# Patient Record
Sex: Male | Born: 1970 | Hispanic: No | Marital: Single | Smoking: Never smoker
Health system: Southern US, Community
[De-identification: ages and names within clinical notes are randomized; demographics above are authoritative.]

## PROBLEM LIST (undated history)

## (undated) ENCOUNTER — Emergency Department (HOSPITAL_COMMUNITY): Admission: EM | Payer: Medicaid Other | Source: Home / Self Care

## (undated) DIAGNOSIS — G8929 Other chronic pain: Secondary | ICD-10-CM

## (undated) DIAGNOSIS — M549 Dorsalgia, unspecified: Secondary | ICD-10-CM

## (undated) DIAGNOSIS — I10 Essential (primary) hypertension: Secondary | ICD-10-CM

## (undated) HISTORY — PX: KNEE ARTHROSCOPY: SUR90

## (undated) HISTORY — PX: TONSILLECTOMY: SUR1361

## (undated) HISTORY — PX: HAND SURGERY: SHX662

---

## 1998-07-01 ENCOUNTER — Emergency Department (HOSPITAL_COMMUNITY): Admission: EM | Admit: 1998-07-01 | Discharge: 1998-07-01 | Payer: Self-pay | Admitting: Emergency Medicine

## 1999-07-07 ENCOUNTER — Emergency Department (HOSPITAL_COMMUNITY): Admission: EM | Admit: 1999-07-07 | Discharge: 1999-07-07 | Payer: Self-pay | Admitting: Emergency Medicine

## 1999-07-07 ENCOUNTER — Encounter: Payer: Self-pay | Admitting: Emergency Medicine

## 2002-05-20 ENCOUNTER — Encounter: Payer: Self-pay | Admitting: Emergency Medicine

## 2002-05-20 ENCOUNTER — Emergency Department (HOSPITAL_COMMUNITY): Admission: EM | Admit: 2002-05-20 | Discharge: 2002-05-21 | Payer: Self-pay

## 2006-02-01 ENCOUNTER — Emergency Department (HOSPITAL_COMMUNITY): Admission: EM | Admit: 2006-02-01 | Discharge: 2006-02-02 | Payer: Self-pay | Admitting: Emergency Medicine

## 2007-07-02 ENCOUNTER — Emergency Department (HOSPITAL_COMMUNITY): Admission: EM | Admit: 2007-07-02 | Discharge: 2007-07-02 | Payer: Self-pay | Admitting: Emergency Medicine

## 2008-11-21 ENCOUNTER — Emergency Department (HOSPITAL_COMMUNITY): Admission: EM | Admit: 2008-11-21 | Discharge: 2008-11-21 | Payer: Self-pay | Admitting: Emergency Medicine

## 2009-01-09 ENCOUNTER — Emergency Department (HOSPITAL_COMMUNITY): Admission: EM | Admit: 2009-01-09 | Discharge: 2009-01-09 | Payer: Self-pay | Admitting: Emergency Medicine

## 2009-03-10 ENCOUNTER — Emergency Department (HOSPITAL_COMMUNITY): Admission: EM | Admit: 2009-03-10 | Discharge: 2009-03-10 | Payer: Self-pay | Admitting: Emergency Medicine

## 2009-05-27 ENCOUNTER — Emergency Department (HOSPITAL_COMMUNITY): Admission: EM | Admit: 2009-05-27 | Discharge: 2009-05-27 | Payer: Self-pay | Admitting: Emergency Medicine

## 2009-06-07 ENCOUNTER — Emergency Department (HOSPITAL_COMMUNITY): Admission: EM | Admit: 2009-06-07 | Discharge: 2009-06-07 | Payer: Self-pay | Admitting: Emergency Medicine

## 2009-06-22 ENCOUNTER — Ambulatory Visit: Payer: Self-pay | Admitting: Vascular Surgery

## 2009-06-22 ENCOUNTER — Emergency Department (HOSPITAL_COMMUNITY): Admission: EM | Admit: 2009-06-22 | Discharge: 2009-06-22 | Payer: Self-pay | Admitting: Emergency Medicine

## 2009-06-22 ENCOUNTER — Encounter (INDEPENDENT_AMBULATORY_CARE_PROVIDER_SITE_OTHER): Payer: Self-pay | Admitting: Emergency Medicine

## 2009-10-17 ENCOUNTER — Emergency Department (HOSPITAL_COMMUNITY): Admission: EM | Admit: 2009-10-17 | Discharge: 2009-10-17 | Payer: Self-pay | Admitting: Emergency Medicine

## 2009-10-31 ENCOUNTER — Emergency Department (HOSPITAL_COMMUNITY): Admission: EM | Admit: 2009-10-31 | Discharge: 2009-10-31 | Payer: Self-pay | Admitting: Emergency Medicine

## 2009-12-27 ENCOUNTER — Emergency Department (HOSPITAL_COMMUNITY)
Admission: EM | Admit: 2009-12-27 | Discharge: 2009-12-27 | Payer: Self-pay | Source: Home / Self Care | Admitting: Emergency Medicine

## 2010-03-28 ENCOUNTER — Emergency Department (HOSPITAL_COMMUNITY): Payer: Medicaid Other

## 2010-03-28 ENCOUNTER — Emergency Department (HOSPITAL_COMMUNITY)
Admission: EM | Admit: 2010-03-28 | Discharge: 2010-03-28 | Disposition: A | Discharge status: Home / Self Care | Payer: Medicaid Other | Attending: Emergency Medicine | Admitting: Emergency Medicine

## 2010-03-28 DIAGNOSIS — R079 Chest pain, unspecified: Secondary | ICD-10-CM | POA: Insufficient documentation

## 2010-03-28 DIAGNOSIS — I10 Essential (primary) hypertension: Secondary | ICD-10-CM | POA: Insufficient documentation

## 2010-03-28 DIAGNOSIS — M542 Cervicalgia: Secondary | ICD-10-CM | POA: Insufficient documentation

## 2010-03-28 DIAGNOSIS — S20219A Contusion of unspecified front wall of thorax, initial encounter: Secondary | ICD-10-CM | POA: Insufficient documentation

## 2010-03-28 DIAGNOSIS — M79609 Pain in unspecified limb: Secondary | ICD-10-CM | POA: Insufficient documentation

## 2010-03-28 DIAGNOSIS — M25519 Pain in unspecified shoulder: Secondary | ICD-10-CM | POA: Insufficient documentation

## 2010-03-28 DIAGNOSIS — S0990XA Unspecified injury of head, initial encounter: Secondary | ICD-10-CM | POA: Insufficient documentation

## 2010-03-28 DIAGNOSIS — S8010XA Contusion of unspecified lower leg, initial encounter: Secondary | ICD-10-CM | POA: Insufficient documentation

## 2010-03-28 DIAGNOSIS — IMO0002 Reserved for concepts with insufficient information to code with codable children: Secondary | ICD-10-CM | POA: Insufficient documentation

## 2010-03-28 LAB — CBC
HCT: 47.5 % (ref 39.0–52.0)
MCV: 89.1 fL (ref 78.0–100.0)
Platelets: 235 10*3/uL (ref 150–400)
RBC: 5.33 MIL/uL (ref 4.22–5.81)
WBC: 8.3 10*3/uL (ref 4.0–10.5)

## 2010-03-28 LAB — DIFFERENTIAL
Basophils Absolute: 0 10*3/uL (ref 0.0–0.1)
Eosinophils Absolute: 0.2 10*3/uL (ref 0.0–0.7)
Lymphocytes Relative: 25 % (ref 12–46)
Lymphs Abs: 2.1 10*3/uL (ref 0.7–4.0)
Neutrophils Relative %: 60 % (ref 43–77)

## 2010-03-28 LAB — BASIC METABOLIC PANEL
Calcium: 9.3 mg/dL (ref 8.4–10.5)
GFR calc Af Amer: >60 mL/min (ref >60)
GFR calc non Af Amer: >60 mL/min (ref >60)
Glucose, Bld: 101 mg/dL — ABNORMAL HIGH (ref 70–99)
Potassium: 4.1 mEq/L (ref 3.5–5.1)
Sodium: 138 mEq/L (ref 135–145)

## 2010-04-06 LAB — URINALYSIS, ROUTINE W REFLEX MICROSCOPIC
Bilirubin Urine: NEGATIVE
Nitrite: NEGATIVE
Specific Gravity, Urine: 1.004 — ABNORMAL LOW (ref 1.005–1.030)
Urobilinogen, UA: 0.2 mg/dL (ref 0.0–1.0)
pH: 6 (ref 5.0–8.0)

## 2010-07-03 ENCOUNTER — Emergency Department (HOSPITAL_COMMUNITY): Payer: Medicaid Other

## 2010-07-03 ENCOUNTER — Emergency Department (HOSPITAL_COMMUNITY)
Admission: EM | Admit: 2010-07-03 | Discharge: 2010-07-03 | Disposition: A | Discharge status: Home / Self Care | Payer: Medicaid Other | Attending: Emergency Medicine | Admitting: Emergency Medicine

## 2010-07-03 DIAGNOSIS — M545 Low back pain, unspecified: Secondary | ICD-10-CM | POA: Insufficient documentation

## 2010-07-03 DIAGNOSIS — I10 Essential (primary) hypertension: Secondary | ICD-10-CM | POA: Insufficient documentation

## 2010-07-03 DIAGNOSIS — M549 Dorsalgia, unspecified: Secondary | ICD-10-CM | POA: Insufficient documentation

## 2010-07-31 ENCOUNTER — Emergency Department (HOSPITAL_COMMUNITY)
Admission: EM | Admit: 2010-07-31 | Discharge: 2010-07-31 | Disposition: A | Discharge status: Home / Self Care | Payer: Medicaid Other | Attending: Emergency Medicine | Admitting: Emergency Medicine

## 2010-07-31 DIAGNOSIS — M545 Low back pain, unspecified: Secondary | ICD-10-CM | POA: Insufficient documentation

## 2010-07-31 DIAGNOSIS — G8929 Other chronic pain: Secondary | ICD-10-CM | POA: Insufficient documentation

## 2010-09-23 ENCOUNTER — Emergency Department (HOSPITAL_COMMUNITY)
Admission: EM | Admit: 2010-09-23 | Discharge: 2010-09-23 | Disposition: A | Discharge status: Home / Self Care | Payer: Medicaid Other | Attending: Emergency Medicine | Admitting: Emergency Medicine

## 2010-09-23 DIAGNOSIS — M545 Low back pain, unspecified: Secondary | ICD-10-CM | POA: Insufficient documentation

## 2010-09-23 DIAGNOSIS — L298 Other pruritus: Secondary | ICD-10-CM | POA: Insufficient documentation

## 2010-09-23 DIAGNOSIS — R21 Rash and other nonspecific skin eruption: Secondary | ICD-10-CM | POA: Insufficient documentation

## 2010-09-23 DIAGNOSIS — Z79899 Other long term (current) drug therapy: Secondary | ICD-10-CM | POA: Insufficient documentation

## 2010-09-23 DIAGNOSIS — I10 Essential (primary) hypertension: Secondary | ICD-10-CM | POA: Insufficient documentation

## 2010-09-23 DIAGNOSIS — G8929 Other chronic pain: Secondary | ICD-10-CM | POA: Insufficient documentation

## 2010-09-23 DIAGNOSIS — L2989 Other pruritus: Secondary | ICD-10-CM | POA: Insufficient documentation

## 2010-09-23 DIAGNOSIS — L255 Unspecified contact dermatitis due to plants, except food: Secondary | ICD-10-CM | POA: Insufficient documentation

## 2010-09-23 DIAGNOSIS — T622X1A Toxic effect of other ingested (parts of) plant(s), accidental (unintentional), initial encounter: Secondary | ICD-10-CM | POA: Insufficient documentation

## 2011-03-11 ENCOUNTER — Encounter (HOSPITAL_COMMUNITY): Payer: Self-pay | Admitting: *Deleted

## 2011-03-11 ENCOUNTER — Emergency Department (HOSPITAL_COMMUNITY)
Admission: EM | Admit: 2011-03-11 | Discharge: 2011-03-11 | Disposition: A | Discharge status: Home / Self Care | Payer: Medicaid Other | Attending: Emergency Medicine | Admitting: Emergency Medicine

## 2011-03-11 DIAGNOSIS — I10 Essential (primary) hypertension: Secondary | ICD-10-CM | POA: Insufficient documentation

## 2011-03-11 DIAGNOSIS — M549 Dorsalgia, unspecified: Secondary | ICD-10-CM | POA: Insufficient documentation

## 2011-03-11 HISTORY — DX: Essential (primary) hypertension: I10

## 2011-03-11 MED ORDER — HYDROMORPHONE HCL PF 1 MG/ML IJ SOLN
1.0000 mg | Freq: Once | INTRAMUSCULAR | Status: AC
  Administered 2011-03-11: 1 mg via INTRAMUSCULAR
  Filled 2011-03-11: qty 1

## 2011-03-11 MED ORDER — NAPROXEN 500 MG PO TABS: 500.0000 mg | ORAL_TABLET | Freq: Two times a day (BID) | ORAL | Status: DC

## 2011-03-11 MED ORDER — OXYCODONE-ACETAMINOPHEN 5-325 MG PO TABS: 1.0000 | ORAL_TABLET | ORAL | Status: AC | PRN

## 2011-03-11 MED ORDER — DIAZEPAM 5 MG PO TABS: 5.0000 mg | ORAL_TABLET | Freq: Three times a day (TID) | ORAL | Status: AC | PRN

## 2011-03-11 MED ORDER — LISINOPRIL 10 MG PO TABS: 10.0000 mg | ORAL_TABLET | Freq: Every day | ORAL | Status: DC

## 2011-03-11 MED ORDER — DIAZEPAM 5 MG PO TABS
5.0000 mg | ORAL_TABLET | Freq: Once | ORAL | Status: AC
  Administered 2011-03-11: 5 mg via ORAL
  Filled 2011-03-11: qty 1

## 2011-03-11 MED ORDER — IBUPROFEN 200 MG PO TABS: 400.0000 mg | ORAL_TABLET | Freq: Once | ORAL | Status: DC

## 2011-03-11 NOTE — Discharge Instructions (Signed)
Back Exercises Back exercises help treat and prevent back injuries. The goal of back exercises is to increase the strength of your abdominal and back muscles and the flexibility of your back. These exercises should be started when you no longer have back pain. Back exercises include:  Pelvic Tilt. Lie on your back with your knees bent. Tilt your pelvis until the lower part of your back is against the floor. Hold this position 5 to 10 sec and repeat 5 to 10 times.   Knee to Chest. Pull first 1 knee up against your chest and hold for 20 to 30 seconds, repeat this with the other knee, and then both knees. This may be done with the other leg straight or bent, whichever feels better.   Sit-Ups or Curl-Ups. Bend your knees 90 degrees. Start with tilting your pelvis, and do a partial, slow sit-up, lifting your trunk only 30 to 45 degrees off the floor. Take at least 2 to 3 seconds for each sit-up. Do not do sit-ups with your knees out straight. If partial sit-ups are difficult, simply do the above but with only tightening your abdominal muscles and holding it as directed.   Hip-Lift. Lie on your back with your knees flexed 90 degrees. Push down with your feet and shoulders as you raise your hips a couple inches off the floor; hold for 10 seconds, repeat 5 to 10 times.   Back arches. Lie on your stomach, propping yourself up on bent elbows. Slowly press on your hands, causing an arch in your low back. Repeat 3 to 5 times. Any initial stiffness and discomfort should lessen with repetition over time.   Shoulder-Lifts. Lie face down with arms beside your body. Keep hips and torso pressed to floor as you slowly lift your head and shoulders off the floor.  Do not overdo your exercises, especially in the beginning. Exercises may cause you some mild back discomfort which lasts for a few minutes; however, if the pain is more severe, or lasts for more than 15 minutes, do not continue exercises until you see your  caregiver. Improvement with exercise therapy for back problems is slow.  See your caregivers for assistance with developing a proper back exercise program. Document Released: 02/12/2004 Document Revised: 09/02/2010 Document Reviewed: 01/04/2005 [facility] Patient Information 2012 [place], [place].Back Pain, Adult Low back pain is very common. About 1 in 5 people have back pain.The cause of low back pain is rarely dangerous. The pain often gets better over time.About half of people with a sudden onset of back pain feel better in just 2 weeks. About 8 in 10 people feel better by 6 weeks.  CAUSES Some common causes of back pain include:  Strain of the muscles or ligaments supporting the spine.   Wear and tear (degeneration) of the spinal discs.   Arthritis.   Direct injury to the back.  DIAGNOSIS Most of the time, the direct cause of low back pain is not known.However, back pain can be treated effectively even when the exact cause of the pain is unknown.Answering your caregiver's questions about your overall health and symptoms is one of the most accurate ways to make sure the cause of your pain is not dangerous. If your caregiver needs more information, he or she may order lab work or imaging tests (X-rays or MRIs).However, even if imaging tests show changes in your back, this usually does not require surgery. HOME CARE INSTRUCTIONS For many people, back pain returns.Since low back pain is rarely  dangerous, it is often a condition that people can learn to [facility] their own.   Remain active. It is stressful on the back to sit or stand in one place. Do not sit, drive, or stand in one place for more than 30 minutes at a time. Take short walks on level surfaces as soon as pain allows.Try to increase the length of time you walk each day.   Do not stay in bed.Resting more than 1 or 2 days can delay your recovery.   Do not avoid exercise or work.Your body is made to move.It is not dangerous  to be active, even though your back may hurt.Your back will likely heal faster if you return to being active before your pain is gone.   Pay attention to your body when you bend and lift. Many people have less discomfortwhen lifting if they bend their knees, keep the load close to their bodies,and avoid twisting. Often, the most comfortable positions are those that put less stress on your recovering back.   Find a comfortable position to sleep. Use a firm mattress and lie on your side with your knees slightly bent. If you lie on your back, put a pillow under your knees.   Only take over-the-counter or prescription medicines as directed by your caregiver. Over-the-counter medicines to reduce pain and inflammation are often the most helpful.Your caregiver may prescribe muscle relaxant drugs.These medicines help dull your pain so you can more quickly return to your normal activities and healthy exercise.   Put ice on the injured area.   Put ice in a plastic bag.   Place a towel between your skin and the bag.   Leave the ice on for 15 to 20 minutes, 3 to 4 times a day for the first 2 to 3 days. After that, ice and heat may be alternated to reduce pain and spasms.   Ask your caregiver about trying back exercises and gentle massage. This may be of some benefit.   Avoid feeling anxious or stressed.Stress increases muscle tension and can worsen back pain.It is important to recognize when you are anxious or stressed and learn ways to manage it.Exercise is a great option.  SEEK MEDICAL CARE IF:  You have pain that is not relieved with rest or medicine.   You have pain that does not improve in 1 week.   You have new symptoms.   You are generally not feeling well.  SEEK IMMEDIATE MEDICAL CARE IF:   You have pain that radiates from your back into your legs.   You develop new bowel or bladder control problems.   You have unusual weakness or numbness in your arms or legs.   You develop  nausea or vomiting.   You develop abdominal pain.   You feel faint.  Document Released: 01/04/2005 Document Revised: 09/16/2010 Document Reviewed: 05/25/2010 [facility] Patient Information 2012 [place], [place].  RESOURCE GUIDE  Dental Problems  Patients with Medicaid: [facility] [phone] [address]                                           [phone] [address] Phone:  [phone]  Phone:  [phone]  If unable to pay or uninsured, contact:  [facility] or [facility]. to become qualified for the adult dental clinic.  Chronic Pain Problems Contact [facility]  [phone] Patients need to be referred by their primary care doctor.  Insufficient Money for Medicine Contact United Way:  call "211" or [facility] [phone].  No Primary Care Doctor Call [facility]  [phone] Other agencies that provide inexpensive medical care    [facility]  [phone]    [facility]  [phone]    [facility]  [phone]    [facility]  [phone]    [facility]  [phone]    [facility]  [phone]  Psychological Services [facility] Behavioral Health  [phone] [facility] Services  [phone] [facility] Mental Health   [phone] (emergency services [phone])  Substance Abuse Resources [facility] Drug [facility]  [phone] [facility] [phone] [facility] [place] [phone] Floydene Flock [phone] Residential & Outpatient Substance Abuse Program  [phone]  Abuse/Neglect [facility] Child Abuse Hotline 513 191 2877 [facility] Child Abuse Hotline (504) 009-7940 (After Hours)  Emergency Shelter [facility] [phone]  Maternity Homes [facility] [place] [facility] [place] [phone] [facility] [phone]  MRSA Hotline #:    920-203-4832    [facility] Resources  [facility] [place]     United Way                          [facility] Dept. [address] [place]                       [address]      [address]  Blondell Reveal Phone:  [phone]                                   Phone:  [phone]                 Phone:  [phone]  [facility] Mental Health Phone:  [phone]  [facility] Child Abuse Hotline 709-250-8669 [phone] (After Hours)

## 2011-03-11 NOTE — ED Notes (Signed)
Patient is resting comfortably. 

## 2011-03-11 NOTE — ED Notes (Signed)
Vital signs stable. 
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
## 2011-03-11 NOTE — ED Notes (Signed)
MD at bedside. 
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
## 2011-04-08 ENCOUNTER — Emergency Department (HOSPITAL_COMMUNITY)
Admission: EM | Admit: 2011-04-08 | Discharge: 2011-04-08 | Disposition: A | Discharge status: Home / Self Care | Payer: Medicaid Other | Attending: Emergency Medicine | Admitting: Emergency Medicine

## 2011-04-08 ENCOUNTER — Encounter (HOSPITAL_COMMUNITY): Payer: Self-pay

## 2011-04-08 DIAGNOSIS — Z79899 Other long term (current) drug therapy: Secondary | ICD-10-CM | POA: Insufficient documentation

## 2011-04-08 DIAGNOSIS — I10 Essential (primary) hypertension: Secondary | ICD-10-CM | POA: Insufficient documentation

## 2011-04-08 DIAGNOSIS — M549 Dorsalgia, unspecified: Secondary | ICD-10-CM | POA: Insufficient documentation

## 2011-04-08 DIAGNOSIS — G8929 Other chronic pain: Secondary | ICD-10-CM | POA: Insufficient documentation

## 2011-04-08 DIAGNOSIS — M545 Low back pain: Secondary | ICD-10-CM

## 2011-04-08 MED ORDER — DIAZEPAM 5 MG PO TABS: 5.0000 mg | ORAL_TABLET | Freq: Two times a day (BID) | ORAL | Status: AC

## 2011-04-08 MED ORDER — OXYCODONE-ACETAMINOPHEN 5-325 MG PO TABS: 1.0000 | ORAL_TABLET | Freq: Four times a day (QID) | ORAL | Status: AC | PRN

## 2011-04-08 NOTE — ED Provider Notes (Signed)
Medical screening examination/treatment/procedure(s) were performed by non-physician practitioner and as supervising physician I was immediately available for consultation/collaboration.   Lennie Vasco M Deloyd Handy, MD 04/08/11 2215 

## 2011-04-08 NOTE — ED Notes (Signed)
Patient reports that he has had low back pain since 1993 and low back recently that began 1 week ago. Patient reports that he has an appointment at the pain clinic on 04/15/11. Patient states he has been having difficulty walking due to pain.
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
## 2011-04-08 NOTE — Discharge Instructions (Signed)
Chronic Back Pain When back pain lasts longer than 3 months, it is called chronic back pain.This pain can be frustrating, but the cause of the pain is rarely dangerous.People with chronic back pain often go through certain periods that are more intense (flare-ups). CAUSES Chronic back pain can be caused by wear and tear (degeneration) on different structures in your back. These structures may include bones, ligaments, or discs. This degeneration may result in more pressure being placed on the nerves that travel to your legs and feet. This can lead to pain traveling from the low back down the back of the legs. When pain lasts longer than 3 months, it is not unusual for people to experience anxiety or depression. Anxiety and depression can also contribute to low back pain. TREATMENT  Establish a regular exercise plan. This is critical to improving your functional level.   Have a self-management plan for when you flare-up. Flare-ups rarely require a medical visit. Regular exercise will help reduce the intensity and frequency of your flare-ups.   Manage how you feel about your back pain and the rest of your life. Anxiety, depression, and feeling that you cannot alter your back pain have been shown to make back pain more intense and debilitating.   Medicines should never be your only treatment. They should be used along with other treatments to help you return to a more active lifestyle.   Procedures such as injections or surgery may be helpful but are rarely necessary. You may be able to get the same results with physical therapy or chiropractic care.  HOME CARE INSTRUCTIONS  Avoid bending, heavy lifting, prolonged sitting, and activities which make the problem worse.   Continue normal activity as much as possible.   Take brief periods of rest throughout the day to reduce your pain during flare-ups.   Follow your back exercise rehabilitation program. This can help reduce symptoms and prevent  more pain.   Only take over-the-counter or prescription medicines as directed by your caregiver. Muscle relaxants are sometimes prescribed. Narcotic pain medicine is discouraged for long-term pain, since addiction is a possible outcome.   If you smoke, quit.   Eat healthy foods and maintain a recommended body weight.  SEEK IMMEDIATE MEDICAL CARE IF:   You have weakness or numbness in one of your legs or feet.   You have trouble controlling your bladder or bowels.   You develop nausea, vomiting, abdominal pain, shortness of breath, or fainting.  Document Released: 02/12/2004 Document Revised: 12/24/2010 Document Reviewed: 12/19/2010 [facility] Patient Information 2012 [place], [place].   RESOURCE GUIDE  Dental Problems  Patients with Medicaid: [facility]                     [phone]                                           Phone:  [phone]                                                  If unable to pay or uninsured, contact:  [facility] or [facility]. to become qualified for the adult dental clinic.  Chronic Pain Problems Contact [facility]  [phone] Patients need to be referred by their primary care doctor.  Insufficient Money for Medicine Contact United Way:  call "211" or [facility] [phone].  No Primary Care Doctor Call [facility]  [phone] Other agencies that provide inexpensive medical care    [facility]  [phone]    [facility] Internal Medicine  [phone]    [facility]  [phone]    [facility]  [phone]    [facility]  [phone]    [facility] Child Clinic  [phone]  Substance Abuse Resources Alcohol and Drug Services  [phone] [facility] 678-111-4966 [facility] [place] [phone] Floydene Flock (336) [phone] Residential & Outpatient Substance Abuse Program  [phone]  Psychological Services [facility] Behavioral Health   [phone] [facility]  [phone] [facility] Mental Health   [phone] (emergency services [phone])  Abuse/Neglect [facility] Child Abuse Hotline 604-372-3343 [facility] Child Abuse Hotline 302-437-9480 (After Hours)  Emergency Shelter [facility] 850-675-2584  Maternity Homes [facility] at the [place] [facility] [place] 508-298-5600 [facility] [phone]  MRSA Hotline #:   551-310-2372    [facility] Resources  [facility] [place]  United Way                           [facility] Dept. [address] [place]                     [address]         [address]  Blondell Reveal Phone:  [phone]                                  Phone:  [phone]                   Phone:  [phone]  [facility] Mental Health Phone:  [phone]  [facility] Child Abuse Hotline ([phone] (After Hours)

## 2011-04-13 ENCOUNTER — Encounter: Payer: Medicaid Other | Attending: Physical Medicine & Rehabilitation

## 2011-04-13 ENCOUNTER — Ambulatory Visit (HOSPITAL_BASED_OUTPATIENT_CLINIC_OR_DEPARTMENT_OTHER): Payer: Medicaid Other | Admitting: Physical Medicine & Rehabilitation

## 2011-04-13 ENCOUNTER — Encounter: Payer: Self-pay | Admitting: Physical Medicine & Rehabilitation

## 2011-04-13 VITALS — BP 135/90 | HR 108 | Resp 16 | Ht 72.0 in | Wt 245.0 lb

## 2011-04-13 DIAGNOSIS — M47816 Spondylosis without myelopathy or radiculopathy, lumbar region: Secondary | ICD-10-CM | POA: Insufficient documentation

## 2011-04-13 DIAGNOSIS — M47817 Spondylosis without myelopathy or radiculopathy, lumbosacral region: Secondary | ICD-10-CM

## 2011-04-13 DIAGNOSIS — M51379 Other intervertebral disc degeneration, lumbosacral region without mention of lumbar back pain or lower extremity pain: Secondary | ICD-10-CM | POA: Insufficient documentation

## 2011-04-13 DIAGNOSIS — M5137 Other intervertebral disc degeneration, lumbosacral region: Secondary | ICD-10-CM

## 2011-04-13 DIAGNOSIS — R209 Unspecified disturbances of skin sensation: Secondary | ICD-10-CM | POA: Insufficient documentation

## 2011-04-13 DIAGNOSIS — M549 Dorsalgia, unspecified: Secondary | ICD-10-CM | POA: Insufficient documentation

## 2011-04-13 DIAGNOSIS — G8929 Other chronic pain: Secondary | ICD-10-CM | POA: Insufficient documentation

## 2011-04-13 MED ORDER — METHOCARBAMOL 500 MG PO TABS: 500.0000 mg | ORAL_TABLET | Freq: Three times a day (TID) | ORAL | Status: AC

## 2011-04-13 NOTE — Patient Instructions (Signed)
Low Back Sprain with Rehab  A sprain is an injury in which a ligament is torn. The ligaments of the lower back are vulnerable to sprains. However, they are strong and require great force to be injured. These ligaments are important for stabilizing the spinal column. Sprains are classified into three categories. Grade 1 sprains cause pain, but the tendon is not lengthened. Grade 2 sprains include a lengthened ligament, due to the ligament being stretched or partially ruptured. With grade 2 sprains there is still function, although the function may be decreased. Grade 3 sprains involve a complete tear of the tendon or muscle, and function is usually impaired. SYMPTOMS   Severe pain in the lower back.   Sometimes, a feeling of a "pop," "snap," or tear, at the time of injury.   Tenderness and sometimes swelling at the injury site.   Uncommonly, bruising (contusion) within 48 hours of injury.   Muscle spasms in the back.  CAUSES  Low back sprains occur when a force is placed on the ligaments that is greater than they can handle. Common causes of injury include:  Performing a stressful act while off-balance.   Repetitive stressful activities that involve movement of the lower back.   Direct hit (trauma) to the lower back.  RISK INCREASES WITH:  Contact sports (football, wrestling).   Collisions (major skiing accidents).   Sports that require throwing or lifting (baseball, weightlifting).   Sports involving twisting of the spine (gymnastics, diving, tennis, golf).   Poor strength and flexibility.   Inadequate protection.   Previous back injury or surgery (especially fusion).  PREVENTION  Wear properly fitted and padded protective equipment.   Warm up and stretch properly before activity.   Allow for adequate recovery between workouts.   Maintain physical fitness:   Strength, flexibility, and endurance.   Cardiovascular fitness.   Maintain a healthy body weight.  PROGNOSIS   If treated properly, low back sprains usually heal with non-surgical treatment. The length of time for healing depends on the severity of the injury.  RELATED COMPLICATIONS   Recurring symptoms, resulting in a chronic problem.   Chronic inflammation and pain in the low back.   Delayed healing or resolution of symptoms, especially if activity is resumed too soon.   Prolonged impairment.   Unstable or arthritic joints of the low back.  TREATMENT  Treatment first involves the use of ice and medicine, to reduce pain and inflammation. The use of strengthening and stretching exercises may help reduce pain with activity. These exercises may be performed at home or with a therapist. Severe injuries may require referral to a therapist for further evaluation and treatment, such as ultrasound. Your caregiver may advise that you wear a back brace or corset, to help reduce pain and discomfort. Often, prolonged bed rest results in greater harm then benefit. Corticosteroid injections may be recommended. However, these should be reserved for the most serious cases. It is important to avoid using your back when lifting objects. At night, sleep on your back on a firm mattress, with a pillow placed under your knees. If non-surgical treatment is unsuccessful, surgery may be needed.  MEDICATION   If pain medicine is needed, nonsteroidal anti-inflammatory medicines (aspirin and ibuprofen), or other minor pain relievers (acetaminophen), are often advised.   Do not take pain medicine for 7 days before surgery.   Prescription pain relievers may be given, if your caregiver thinks they are needed. Use only as directed and only as much as you   need.   Ointments applied to the skin may be helpful.   Corticosteroid injections may be given by your caregiver. These injections should be reserved for the most serious cases, because they may only be given a certain number of times.  HEAT AND COLD  Cold treatment (icing)  should be applied for 10 to 15 minutes every 2 to 3 hours for inflammation and pain, and immediately after activity that aggravates your symptoms. Use ice packs or an ice massage.   Heat treatment may be used before performing stretching and strengthening activities prescribed by your caregiver, physical therapist, or athletic trainer. Use a heat pack or a warm water soak.  SEEK MEDICAL CARE IF:   Symptoms get worse or do not improve in 2 to 4 weeks, despite treatment.   You develop numbness or weakness in either leg.   You lose bowel or bladder function.   Any of the following occur after surgery: fever, increased pain, swelling, redness, drainage of fluids, or bleeding in the affected area.   New, unexplained symptoms develop. (Drugs used in treatment may produce side effects.)  EXERCISES  RANGE OF MOTION (ROM) AND STRETCHING EXERCISES - Low Back Sprain Most people with lower back pain will find that their symptoms get worse with excessive bending forward (flexion) or arching at the lower back (extension). The exercises that will help resolve your symptoms will focus on the opposite motion.  Your physician, physical therapist or athletic trainer will help you determine which exercises will be most helpful to resolve your lower back pain. Do not complete any exercises without first consulting with your caregiver. Discontinue any exercises which make your symptoms worse, until you speak to your caregiver. If you have pain, numbness or tingling which travels down into your buttocks, leg or foot, the goal of the therapy is for these symptoms to move closer to your back and eventually resolve. Sometimes, these leg symptoms will get better, but your lower back pain may worsen. This is often an indication of progress in your rehabilitation. Be very alert to any changes in your symptoms and the activities in which you participated in the 24 hours prior to the change. Sharing this information with your  caregiver will allow him or her to most efficiently treat your condition. These exercises may help you when beginning to rehabilitate your injury. Your symptoms may resolve with or without further involvement from your physician, physical therapist or athletic trainer. While completing these exercises, remember:   Restoring tissue flexibility helps normal motion to return to the joints. This allows healthier, less painful movement and activity.   An effective stretch should be held for at least 30 seconds.   A stretch should never be painful. You should only feel a gentle lengthening or release in the stretched tissue.  FLEXION RANGE OF MOTION AND STRETCHING EXERCISES: STRETCH - Flexion, Single Knee to Chest   Lie on a firm bed or floor with both legs extended in front of you.   Keeping one leg in contact with the floor, bring your opposite knee to your chest. Hold your leg in place by either grabbing behind your thigh or at your knee.   Pull until you feel a gentle stretch in your low back. Hold __________ seconds.   Slowly release your grasp and repeat the exercise with the opposite side.  Repeat __________ times. Complete this exercise __________ times per day.  STRETCH - Flexion, Double Knee to Chest  Lie on a firm bed or   floor with both legs extended in front of you.   Keeping one leg in contact with the floor, bring your opposite knee to your chest.   Tense your stomach muscles to support your back and then lift your other knee to your chest. Hold your legs in place by either grabbing behind your thighs or at your knees.   Pull both knees toward your chest until you feel a gentle stretch in your low back. Hold __________ seconds.   Tense your stomach muscles and slowly return one leg at a time to the floor.  Repeat __________ times. Complete this exercise __________ times per day.  STRETCH - Low Trunk Rotation  Lie on a firm bed or floor. Keeping your legs in front of you, bend  your knees so they are both pointed toward the ceiling and your feet are flat on the floor.   Extend your arms out to the side. This will stabilize your upper body by keeping your shoulders in contact with the floor.   Gently and slowly drop both knees together to one side until you feel a gentle stretch in your low back. Hold for __________ seconds.   Tense your stomach muscles to support your lower back as you bring your knees back to the starting position. Repeat the exercise to the other side.  Repeat __________ times. Complete this exercise __________ times per day  EXTENSION RANGE OF MOTION AND FLEXIBILITY EXERCISES: STRETCH - Extension, Prone on Elbows   Lie on your stomach on the floor, a bed will be too soft. Place your palms about shoulder width apart and at the height of your head.   Place your elbows under your shoulders. If this is too painful, stack pillows under your chest.   Allow your body to relax so that your hips drop lower and make contact more completely with the floor.   Hold this position for __________ seconds.   Slowly return to lying flat on the floor.  Repeat __________ times. Complete this exercise __________ times per day.  RANGE OF MOTION - Extension, Prone Press Ups  Lie on your stomach on the floor, a bed will be too soft. Place your palms about shoulder width apart and at the height of your head.   Keeping your back as relaxed as possible, slowly straighten your elbows while keeping your hips on the floor. You may adjust the placement of your hands to maximize your comfort. As you gain motion, your hands will come more underneath your shoulders.   Hold this position __________ seconds.   Slowly return to lying flat on the floor.  Repeat __________ times. Complete this exercise __________ times per day.  RANGE OF MOTION- Quadruped, Neutral Spine   Assume a hands and knees position on a firm surface. Keep your hands under your shoulders and your knees  under your hips. You may place padding under your knees for comfort.   Drop your head and point your tailbone toward the ground below you. This will round out your lower back like an angry cat. Hold this position for __________ seconds.   Slowly lift your head and release your tail bone so that your back sags into a large arch, like an old horse.   Hold this position for __________ seconds.   Repeat this until you feel limber in your low back.   Now, find your "sweet spot." This will be the most comfortable position somewhere between the two previous positions. This is your neutral spine.   Once you have found this position, tense your stomach muscles to support your low back.   Hold this position for __________ seconds.  Repeat __________ times. Complete this exercise __________ times per day.  STRENGTHENING EXERCISES - Low Back Sprain These exercises may help you when beginning to rehabilitate your injury. These exercises should be done near your "sweet spot." This is the neutral, low-back arch, somewhere between fully rounded and fully arched, that is your least painful position. When performed in this safe range of motion, these exercises can be used for people who have either a flexion or extension based injury. These exercises may resolve your symptoms with or without further involvement from your physician, physical therapist or athletic trainer. While completing these exercises, remember:   Muscles can gain both the endurance and the strength needed for everyday activities through controlled exercises.   Complete these exercises as instructed by your physician, physical therapist or athletic trainer. Increase the resistance and repetitions only as guided.   You may experience muscle soreness or fatigue, but the pain or discomfort you are trying to eliminate should never worsen during these exercises. If this pain does worsen, stop and make certain you are following the directions exactly.  If the pain is still present after adjustments, discontinue the exercise until you can discuss the trouble with your caregiver.  STRENGTHENING - Deep Abdominals, Pelvic Tilt   Lie on a firm bed or floor. Keeping your legs in front of you, bend your knees so they are both pointed toward the ceiling and your feet are flat on the floor.   Tense your lower abdominal muscles to press your low back into the floor. This motion will rotate your pelvis so that your tail bone is scooping upwards rather than pointing at your feet or into the floor.  With a gentle tension and even breathing, hold this position for __________ seconds. Repeat __________ times. Complete this exercise __________ times per day.  STRENGTHENING - Abdominals, Crunches   Lie on a firm bed or floor. Keeping your legs in front of you, bend your knees so they are both pointed toward the ceiling and your feet are flat on the floor. Cross your arms over your chest.   Slightly tip your chin down without bending your neck.   Tense your abdominals and slowly lift your trunk high enough to just clear your shoulder blades. Lifting higher can put excessive stress on the lower back and does not further strengthen your abdominal muscles.   Control your return to the starting position.  Repeat __________ times. Complete this exercise __________ times per day.  STRENGTHENING - Quadruped, Opposite UE/LE Lift   Assume a hands and knees position on a firm surface. Keep your hands under your shoulders and your knees under your hips. You may place padding under your knees for comfort.   Find your neutral spine and gently tense your abdominal muscles so that you can maintain this position. Your shoulders and hips should form a rectangle that is parallel with the floor and is not twisted.   Keeping your trunk steady, lift your right hand no higher than your shoulder and then your left leg no higher than your hip. Make sure you are not holding your  breath. Hold this position for __________ seconds.   Continuing to keep your abdominal muscles tense and your back steady, slowly return to your starting position. Repeat with the opposite arm and leg.  Repeat __________ times. Complete this exercise __________ times   per day.  STRENGTHENING - Abdominals and Quadriceps, Straight Leg Raise   Lie on a firm bed or floor with both legs extended in front of you.   Keeping one leg in contact with the floor, bend the other knee so that your foot can rest flat on the floor.   Find your neutral spine, and tense your abdominal muscles to maintain your spinal position throughout the exercise.   Slowly lift your straight leg off the floor about 6 inches for a count of 15, making sure to not hold your breath.   Still keeping your neutral spine, slowly lower your leg all the way to the floor.  Repeat this exercise with each leg __________ times. Complete this exercise __________ times per day. POSTURE AND BODY MECHANICS CONSIDERATIONS - Low Back Sprain Keeping correct posture when sitting, standing or completing your activities will reduce the stress put on different body tissues, allowing injured tissues a chance to heal and limiting painful experiences. The following are general guidelines for improved posture. Your physician or physical therapist will provide you with any instructions specific to your needs. While reading these guidelines, remember:  The exercises prescribed by your provider will help you have the flexibility and strength to maintain correct postures.   The correct posture provides the best environment for your joints to work. All of your joints have less wear and tear when properly supported by a spine with good posture. This means you will experience a healthier, less painful body.   Correct posture must be practiced with all of your activities, especially prolonged sitting and standing. Correct posture is as important when doing  repetitive low-stress activities (typing) as it is when doing a single heavy-load activity (lifting).  RESTING POSITIONS Consider which positions are most painful for you when choosing a resting position. If you have pain with flexion-based activities (sitting, bending, stooping, squatting), choose a position that allows you to rest in a less flexed posture. You would want to avoid curling into a fetal position on your side. If your pain worsens with extension-based activities (prolonged standing, working overhead), avoid resting in an extended position such as sleeping on your stomach. Most people will find more comfort when they rest with their spine in a more neutral position, neither too rounded nor too arched. Lying on a non-sagging bed on your side with a pillow between your knees, or on your back with a pillow under your knees will often provide some relief. Keep in mind, being in any one position for a prolonged period of time, no matter how correct your posture, can still lead to stiffness. PROPER SITTING POSTURE In order to minimize stress and discomfort on your spine, you must sit with correct posture. Sitting with good posture should be effortless for a healthy body. Returning to good posture is a gradual process. Many people can work toward this most comfortably by using various supports until they have the flexibility and strength to maintain this posture on their own. When sitting with proper posture, your ears will fall over your shoulders and your shoulders will fall over your hips. You should use the back of the chair to support your upper back. Your lower back will be in a neutral position, just slightly arched. You may place a small pillow or folded towel at the base of your lower back for  support.  When working at a desk, create an environment that supports good, upright posture. Without extra support, muscles tire, which leads to excessive   strain on joints and other tissues. Keep these  recommendations in mind: CHAIR:  A chair should be able to slide under your desk when your back makes contact with the back of the chair. This allows you to work closely.   The chair's height should allow your eyes to be level with the upper part of your monitor and your hands to be slightly lower than your elbows.  BODY POSITION  Your feet should make contact with the floor. If this is not possible, use a foot rest.   Keep your ears over your shoulders. This will reduce stress on your neck and low back.  INCORRECT SITTING POSTURES  If you are feeling tired and unable to assume a healthy sitting posture, do not slouch or slump. This puts excessive strain on your back tissues, causing more damage and pain. Healthier options include:  Using more support, like a lumbar pillow.   Switching tasks to something that requires you to be upright or walking.   Talking a brief walk.   Lying down to rest in a neutral-spine position.  PROLONGED STANDING WHILE SLIGHTLY LEANING FORWARD  When completing a task that requires you to lean forward while standing in one place for a long time, place either foot up on a stationary 2-4 inch high object to help maintain the best posture. When both feet are on the ground, the lower back tends to lose its slight inward curve. If this curve flattens (or becomes too large), then the back and your other joints will experience too much stress, tire more quickly, and can cause pain. CORRECT STANDING POSTURES Proper standing posture should be assumed with all daily activities, even if they only take a few moments, like when brushing your teeth. As in sitting, your ears should fall over your shoulders and your shoulders should fall over your hips. You should keep a slight tension in your abdominal muscles to brace your spine. Your tailbone should point down to the ground, not behind your body, resulting in an over-extended swayback posture.  INCORRECT STANDING POSTURES    Common incorrect standing postures include a forward head, locked knees and/or an excessive swayback. WALKING Walk with an upright posture. Your ears, shoulders and hips should all line-up. PROLONGED ACTIVITY IN A FLEXED POSITION When completing a task that requires you to bend forward at your waist or lean over a low surface, try to find a way to stabilize 3 out of 4 of your limbs. You can place a hand or elbow on your thigh or rest a knee on the surface you are reaching across. This will provide you more stability, so that your muscles do not tire as quickly. By keeping your knees relaxed, or slightly bent, you will also reduce stress across your lower back. CORRECT LIFTING TECHNIQUES DO :  Assume a wide stance. This will provide you more stability and the opportunity to get as close as possible to the object which you are lifting.   Tense your abdominals to brace your spine. Bend at the knees and hips. Keeping your back locked in a neutral-spine position, lift using your leg muscles. Lift with your legs, keeping your back straight.   Test the weight of unknown objects before attempting to lift them.   Try to keep your elbows locked down at your sides in order get the best strength from your shoulders when carrying an object.   Always ask for help when lifting heavy or awkward objects.  INCORRECT LIFTING TECHNIQUES DO   NOT:   Lock your knees when lifting, even if it is a small object.   Bend and twist. Pivot at your feet or move your feet when needing to change directions.   Assume that you can safely pick up even a paperclip without proper posture.  Document Released: 01/04/2005 Document Revised: 12/24/2010 Document Reviewed: 04/18/2008 [employer] Patient Information 2012 [employer] 

## 2011-04-13 NOTE — Progress Notes (Signed)
Subjective:    Patient ID: [name] [name], male    DOB: [birth year], 41 y.o.   MRN: [id number]  HPI 41 year old male with a several year history of back pain. This is his chief complaint. He said orthopedic evaluation as well as neurosurgical evaluation. He has an MRI of the lumbar spine performed at [facility] per radiology on November 20 to 2012 which showed moderate disc degeneration at L4-5 disc protrusion towards the left side which did not show any significant compression of the left L5 or left L4 nerve root. He also had a broad-based disc protrusion with lateral extension at L5-S1 mainly on the right side.  I reviewed his x-rays of his lumbar spine showed facet joint degenerative changes at L5-S1 primarily. He had a CT scan of his cervical spine showed some spondylosis at C5-C6 but no cord compression.  He's never tried physical therapy for his back. He said what sounds like epidural injections which were not particularly helpful. He denies any pain shooting down his legs. He does have bilateral knee pain. Pain Inventory Average Pain 7 Pain Right Now 8 My pain is constant  In the last 24 hours, has pain interfered with the following? General activity 9 Relation with others 0 Enjoyment of life 7 What TIME of day is your pain at its worst? evening Sleep (in general) Fair  Pain is worse with: walking, bending and some activites Pain improves with: medication Relief from Meds: 7  Mobility walk without assistance ability to climb steps?  no do you drive?  yes  Function employed # of hrs/week 50-60  Neuro/Psych weakness numbness trouble walking  Prior Studies x-rays CT/MRI  Physicians involved in your care Orthopedist Dr. Magnus Ivan  Review of Systems  Constitutional: Positive for unexpected weight change.  HENT: Negative.   Eyes: Negative.   Respiratory: Negative.   Cardiovascular: Negative.   Gastrointestinal: Negative.   Genitourinary: Negative.     Musculoskeletal: Negative.   Skin: Negative.   Neurological: Positive for weakness and numbness.  Hematological: Negative.   Psychiatric/Behavioral: Negative.        Objective:   Physical Exam  Constitutional: He is oriented to person, place, and time. He appears well-developed and well-nourished.  HENT:  Head: Normocephalic and atraumatic.  Musculoskeletal:       Right hip: Normal.       Left hip: Normal.       Right knee: Normal.       Left knee: Normal.       Lumbar back: He exhibits decreased range of motion, tenderness and pain. He exhibits no spasm.       Diffuse superficial tenderness starting at L3 going down to S1  Neurological: He is alert and oriented to person, place, and time. He has normal strength. No sensory deficit.  Reflex Scores:      Tricep reflexes are 2+ on the right side and 2+ on the left side.      Bicep reflexes are 2+ on the right side and 2+ on the left side.      Brachioradialis reflexes are 2+ on the right side and 2+ on the left side.      Patellar reflexes are 2+ on the right side and 2+ on the left side.      Achilles reflexes are 2+ on the right side and 2+ on the left side.      Decreased sensation left L5 dermatomal distribution forward flexed ambulation  Psychiatric: He has a normal mood and  affect. His behavior is normal. Judgment and thought content normal.          Assessment & Plan:  1 chronic axial back pain without signs of radiculopathy other than some minor sensory loss in the left L5 dermatomal distribution. As I discussed with patient this is likely a multifactorial problem which in its walls the discs, muscles, and facet joints. I will set him up with facet joint medial black branch blocks. Will also set him up with outpatient physical therapy. We'll check a urine drug screen and then if consistent start oxycodone 10 mg 3 times per day.  Over half of the 45 minute visit was spent counseling and coordination of care. I educated  the patient on lumbar spine anatomy using both diagrams and anatomic models

## 2011-04-14 ENCOUNTER — Encounter: Payer: Self-pay | Admitting: Physical Medicine & Rehabilitation

## 2011-04-15 ENCOUNTER — Telehealth: Payer: Self-pay | Admitting: Physical Medicine & Rehabilitation

## 2011-04-15 NOTE — Telephone Encounter (Signed)
UDS results? °

## 2011-04-19 ENCOUNTER — Encounter: Payer: Self-pay | Admitting: Physical Medicine & Rehabilitation

## 2011-04-19 ENCOUNTER — Other Ambulatory Visit: Payer: Self-pay | Admitting: *Deleted

## 2011-04-19 MED ORDER — HYDROCODONE-ACETAMINOPHEN 10-325 MG PO TABS: 1.0000 | ORAL_TABLET | Freq: Three times a day (TID) | ORAL | Status: AC

## 2011-04-19 NOTE — Telephone Encounter (Signed)
Pt aware that we have not gotten his UDS results back yet.

## 2011-04-26 ENCOUNTER — Other Ambulatory Visit: Payer: Self-pay | Admitting: *Deleted

## 2011-04-26 MED ORDER — OXYCODONE HCL 10 MG PO TABS: 10.0000 mg | ORAL_TABLET | Freq: Three times a day (TID) | ORAL | Status: DC

## 2011-04-29 ENCOUNTER — Ambulatory Visit: Payer: Medicaid Other | Attending: Physical Medicine & Rehabilitation | Admitting: Physical Therapy

## 2011-05-13 ENCOUNTER — Encounter: Payer: Medicaid Other | Attending: Physical Medicine & Rehabilitation

## 2011-05-13 ENCOUNTER — Ambulatory Visit (HOSPITAL_BASED_OUTPATIENT_CLINIC_OR_DEPARTMENT_OTHER): Payer: Medicaid Other | Admitting: Physical Medicine & Rehabilitation

## 2011-05-13 ENCOUNTER — Encounter: Payer: Self-pay | Admitting: Physical Medicine & Rehabilitation

## 2011-05-13 VITALS — BP 144/84 | HR 78 | Resp 16 | Ht 72.0 in | Wt 241.0 lb

## 2011-05-13 DIAGNOSIS — G8929 Other chronic pain: Secondary | ICD-10-CM | POA: Insufficient documentation

## 2011-05-13 DIAGNOSIS — M47816 Spondylosis without myelopathy or radiculopathy, lumbar region: Secondary | ICD-10-CM

## 2011-05-13 DIAGNOSIS — M5137 Other intervertebral disc degeneration, lumbosacral region: Secondary | ICD-10-CM

## 2011-05-13 DIAGNOSIS — M47817 Spondylosis without myelopathy or radiculopathy, lumbosacral region: Secondary | ICD-10-CM

## 2011-05-13 DIAGNOSIS — R209 Unspecified disturbances of skin sensation: Secondary | ICD-10-CM | POA: Insufficient documentation

## 2011-05-13 DIAGNOSIS — M549 Dorsalgia, unspecified: Secondary | ICD-10-CM | POA: Insufficient documentation

## 2011-05-13 MED ORDER — OXYCODONE HCL 10 MG PO TABS: 10.0000 mg | ORAL_TABLET | Freq: Three times a day (TID) | ORAL | Status: DC

## 2011-05-13 NOTE — Progress Notes (Signed)
  Subjective:    Patient ID: [name] [name], male    DOB: Mar 23, 1970, 41 y.o.   MRN: [id number]  HPI Patient had medial branch blocks scheduled for today however he states that he has to reschedule because of a death in the family. He has to drive 5 and half hours to meet with the funeral home director. Patient has had no new medical problems. Last UDS reviewed no oxycodone Mrs. [name] [name] to me prescribing the medication. He told me that he had not taken any for quite some time before which would make this consistent however he did indicate to the technician that he had taken a dose of oxycodone a day before. Known medical problems. He does not have a primary physician he takes Zestril he could not tell me where he got the prescription from. Pain Inventory Average Pain 7 Pain Right Now 8 My pain is sharp, stabbing and aching  In the last 24 hours, has pain interfered with the following? General activity 7 Relation with others 0 Enjoyment of life 5 What TIME of day is your pain at its worst? morning and evening Sleep (in general) Fair  Pain is worse with: some activites Pain improves with: rest and medication Relief from Meds: 7  Mobility walk without assistance ability to climb steps?  yes do you drive?  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  no  Physicians involved in your care Any changes since last visit?  no       Review of Systems  Neurological: Positive for weakness and numbness.  All other systems reviewed and are negative.       Objective:   Physical Exam  Constitutional: He is oriented to person, place, and time. He appears well-developed and well-nourished.  Musculoskeletal:       Lumbar back: He exhibits decreased range of motion. He exhibits no tenderness.       Patient with reduced lumbar flexion and extension Gait is forward flexed.    Neurological: He is alert and oriented to person, place, and time. He has normal strength.  Reflex Scores:      Patellar reflexes are 2+ on the right side and 2+ on the left side.      Achilles reflexes are 2+ on the right side and 2+ on the left side. Psychiatric: He has a normal mood and affect.          Assessment & Plan:  1. 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 Will reschedule for medial branch blocks. Given his inconsistent UDS as an initial we will repeat now that we're prescribing oxycodone to make sure he is taking this. If he is not showing any signs of oxycodone in his urine we will discharge the patient.

## 2011-05-18 ENCOUNTER — Encounter: Payer: Self-pay | Admitting: Physical Medicine & Rehabilitation

## 2011-05-19 ENCOUNTER — Encounter: Payer: Self-pay | Admitting: Physical Medicine & Rehabilitation

## 2011-05-19 ENCOUNTER — Telehealth: Payer: Self-pay | Admitting: *Deleted

## 2011-05-19 NOTE — Telephone Encounter (Signed)
Letter sent/mjk

## 2011-05-19 NOTE — Telephone Encounter (Signed)
Pt aware of UDS results and discharge status. He claims that cocaine showed up because he had a lidocaine patch on the day the test was done. Another treating physician told him that lidocaine patches can cause a false positive. He wants to bring the letter up here but I advised him that Dr. Wynn Banker could not change his mind.  Please send a discharge letter and a copy of the treating physicians in our area. Thanks.

## 2011-05-19 NOTE — Telephone Encounter (Signed)
Message copied by Caryl Ada on Wed May 19, 2011  8:05 AM ------      Message from: Ernestina Columbia      Created: Tue May 18, 2011  4:29 PM       Discharge due to cocaine

## 2011-05-26 ENCOUNTER — Telehealth: Payer: Self-pay | Admitting: Physical Medicine & Rehabilitation

## 2011-05-26 NOTE — Telephone Encounter (Signed)
Would like to talk to Dr Wynn Banker.

## 2011-05-27 ENCOUNTER — Telehealth: Payer: Self-pay | Admitting: *Deleted

## 2011-05-27 ENCOUNTER — Telehealth: Payer: Self-pay

## 2011-05-27 NOTE — Telephone Encounter (Signed)
Called Dr Lerry Liner to verify letter written for pt.  Per office manager letter was written due to pt request.  Pt is no longer a pt at the office due to the issue.  Print production planner says they called a pharmacy to verify interaction.  Pt made a big deal at Dr Mayford Knife the office about the situation they wrote the letter to appease him.

## 2011-05-27 NOTE — Telephone Encounter (Signed)
I reviewed the UDS and the letter from the MD.  Lidocaine will NOT cause a positive cocaine result.  I have nothing more to say to patient.  Offer referral to [facility]

## 2011-05-27 NOTE — Telephone Encounter (Signed)
Mr Cecchi returning my call.  I explained that Dr Wynn Banker was not going to reconsider his discharge from the clinic, even after bringing in the letter dated 03/20/2009 from previous MD. [employer] disputes the fact that lidoderm patches could cause a false positive. He said that the previous physician even spoke with the manufacturer and said it could.  I explained that we had spoken to the previous physician's office, and we were told it was a pharmacy that he had spoken with and that it was not enough to override the results or Dr Wynn Banker decision. I also told him that a large number of our patients wore lidoderm patches and we had never had a positive cocaine test on any other person. He denied EVER using cocaine but accepted our decision.

## 2011-05-27 NOTE — Telephone Encounter (Signed)
I spoke with [facility] toxicologist with [employer] about the question of lidocaine patches causing a positive cocaine test.  She said this is not possible and that [name] [name] test was also positive for the metabolite that can only come from cocaine being present, so his test is a valid positive.

## 2011-06-10 ENCOUNTER — Ambulatory Visit: Payer: Medicaid Other | Admitting: Physical Medicine & Rehabilitation

## 2011-06-30 ENCOUNTER — Emergency Department (HOSPITAL_COMMUNITY)
Admission: EM | Admit: 2011-06-30 | Discharge: 2011-06-30 | Disposition: A | Discharge status: Home / Self Care | Payer: Medicaid Other | Attending: Emergency Medicine | Admitting: Emergency Medicine

## 2011-06-30 ENCOUNTER — Encounter (HOSPITAL_COMMUNITY): Payer: Self-pay | Admitting: *Deleted

## 2011-06-30 DIAGNOSIS — I10 Essential (primary) hypertension: Secondary | ICD-10-CM | POA: Insufficient documentation

## 2011-06-30 DIAGNOSIS — M549 Dorsalgia, unspecified: Secondary | ICD-10-CM

## 2011-06-30 DIAGNOSIS — G8929 Other chronic pain: Secondary | ICD-10-CM | POA: Insufficient documentation

## 2011-06-30 HISTORY — DX: Dorsalgia, unspecified: M54.9

## 2011-06-30 HISTORY — DX: Other chronic pain: G89.29

## 2011-06-30 MED ORDER — OXYCODONE-ACETAMINOPHEN 5-325 MG PO TABS
ORAL_TABLET | ORAL | Status: AC
Start: 2011-06-30 — End: 2011-07-10

## 2011-06-30 NOTE — Discharge Instructions (Signed)
Continue your ibuprofen for pain and inflammation using percocet for breakthrough pain but follow up with Dr. Magnus Ivan to further discuss your flares of back pain with chronic back pain is VERY important. Return to ER for emergent changing or worsening of symptoms.   Chronic Back Pain When back pain lasts longer than 3 months, it is called chronic back pain.This pain can be frustrating, but the cause of the pain is rarely dangerous.People with chronic back pain often go through certain periods that are more intense (flare-ups). CAUSES Chronic back pain can be caused by wear and tear (degeneration) on different structures in your back. These structures may include bones, ligaments, or discs. This degeneration may result in more pressure being placed on the nerves that travel to your legs and feet. This can lead to pain traveling from the low back down the back of the legs. When pain lasts longer than 3 months, it is not unusual for people to experience anxiety or depression. Anxiety and depression can also contribute to low back pain. TREATMENT  Establish a regular exercise plan. This is critical to improving your functional level.   Have a self-management plan for when you flare-up. Flare-ups rarely require a medical visit. Regular exercise will help reduce the intensity and frequency of your flare-ups.   Manage how you feel about your back pain and the rest of your life. Anxiety, depression, and feeling that you cannot alter your back pain have been shown to make back pain more intense and debilitating.   Medicines should never be your only treatment. They should be used along with other treatments to help you return to a more active lifestyle.   Procedures such as injections or surgery may be helpful but are rarely necessary. You may be able to get the same results with physical therapy or chiropractic care.  HOME CARE INSTRUCTIONS  Avoid bending, heavy lifting, prolonged sitting, and  activities which make the problem worse.   Continue normal activity as much as possible.   Take brief periods of rest throughout the day to reduce your pain during flare-ups.   Follow your back exercise rehabilitation program. This can help reduce symptoms and prevent more pain.   Only take over-the-counter or prescription medicines as directed by your caregiver. Muscle relaxants are sometimes prescribed. Narcotic pain medicine is discouraged for long-term pain, since addiction is a possible outcome.   If you smoke, quit.   Eat healthy foods and maintain a recommended body weight.  SEEK IMMEDIATE MEDICAL CARE IF:   You have weakness or numbness in one of your legs or feet.   You have trouble controlling your bladder or bowels.   You develop nausea, vomiting, abdominal pain, shortness of breath, or fainting.  Document Released: 02/12/2004 Document Revised: 12/24/2010 Document Reviewed: 12/19/2010 [facility] Patient Information 2012 [place], [place].
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
## 2011-06-30 NOTE — ED Provider Notes (Signed)
Medical screening examination/treatment/procedure(s) were performed by non-physician practitioner and as supervising physician I was immediately available for consultation/collaboration.   Celene Kras, MD 06/30/11 [phone]

## 2011-06-30 NOTE — ED Notes (Signed)
Pt is driving himself, therefore meds for pain withheld until seen by MD

## 2011-06-30 NOTE — ED Notes (Signed)
Bent over on Sat to pick up tool, developed lower back pain, has a history of chronic back pain, takes 800 mg Ibuprofen in mornings without relief.

## 2011-06-30 NOTE — ED Notes (Signed)
Patient called requesting refill on Lisinopril , patient was informed that we do not do refill and he would need to contact his PCP. Patient stated that he did not have a PCP. According to records patient has [place] [facility] which means he was given a PCP by Medicaid. I discussed situation with Drucie Opitz and she agreed that patient needs to see PCP and she would only approve a 10 day supply of the medication and patient was informed of same and  to f/u with PCP.patient stated he would work something else out and hung up.

## 2011-07-22 ENCOUNTER — Emergency Department (HOSPITAL_COMMUNITY)
Admission: EM | Admit: 2011-07-22 | Discharge: 2011-07-22 | Disposition: A | Discharge status: Home / Self Care | Payer: Medicaid Other | Attending: Emergency Medicine | Admitting: Emergency Medicine

## 2011-07-22 ENCOUNTER — Encounter (HOSPITAL_COMMUNITY): Payer: Self-pay | Admitting: Emergency Medicine

## 2011-07-22 DIAGNOSIS — M549 Dorsalgia, unspecified: Secondary | ICD-10-CM | POA: Insufficient documentation

## 2011-07-22 DIAGNOSIS — I10 Essential (primary) hypertension: Secondary | ICD-10-CM | POA: Insufficient documentation

## 2011-07-22 DIAGNOSIS — G8929 Other chronic pain: Secondary | ICD-10-CM | POA: Insufficient documentation

## 2011-07-22 MED ORDER — HYDROCODONE-ACETAMINOPHEN 5-500 MG PO TABS: 1.0000 | ORAL_TABLET | Freq: Four times a day (QID) | ORAL | Status: AC | PRN

## 2011-07-22 MED ORDER — CYCLOBENZAPRINE HCL 10 MG PO TABS: 10.0000 mg | ORAL_TABLET | Freq: Two times a day (BID) | ORAL | Status: AC | PRN

## 2011-07-22 MED ORDER — LISINOPRIL 20 MG PO TABS: 10.0000 mg | ORAL_TABLET | Freq: Every day | ORAL | Status: DC

## 2011-07-22 NOTE — ED Notes (Signed)
Pt reports recurrent low back pain, referred to pain clinic by Dr Magnus Ivan, will be seen by Dr Metta Clines on 7/11

## 2011-07-22 NOTE — ED Provider Notes (Signed)
History     CSN: [id number]  Arrival date & time 07/22/11  0848   First MD Initiated Contact with Patient 07/22/11 [phone]      Chief Complaint  Patient presents with  . Back Pain    low back pain x 10 years. Hx fracture coccyx, arthritis    (Consider location/radiation/quality/duration/timing/severity/associated sxs/prior treatment) Patient is a 41 y.o. male presenting with back pain. The history is provided by the patient.  Back Pain  This is a recurrent problem. The pain is associated with no known injury. The pain is present in the lumbar spine. The quality of the pain is described as shooting. The pain does not radiate. The pain is at a severity of 7/10. The symptoms are aggravated by bending and certain positions. The pain is worse during the day. Pertinent negatives include no fever, no numbness, no abdominal pain, no abdominal swelling, no bowel incontinence, no perianal numbness, no bladder incontinence, no dysuria, no leg pain, no paresthesias, no tingling and no weakness. He has tried nothing for the symptoms.  Pt with chronic back pain for years. States used to be followed by Dr. Jordan Likes. Trying to hold off on having surgery as long as he can. Scheduled to see pain management, Dr. Metta Clines on 7/11. States pain worsened. No new injuries. No pain radiation. No bowel incontinence, no urinary retention. No fever, no abdominal pain. No other complaints. States "i just need something to take the edge off until i follo wup. "   Past Medical History  Diagnosis Date  . Hypertension   . Back pain, chronic     Past Surgical History  Procedure Date  . Knee arthroscopy   . Hand surgery   . Tonsillectomy     Family History  Problem Relation Age of Onset  . Cancer Mother     History  Substance Use Topics  . Smoking status: Never Smoker   . Smokeless tobacco: Not on file  . Alcohol Use: 3.0 oz/week    5 Cans of beer per week      Review of Systems  Constitutional: Negative for  fever and chills.  Respiratory: Negative.   Cardiovascular: Negative.   Gastrointestinal: Negative for nausea, vomiting, abdominal pain and bowel incontinence.  Genitourinary: Negative for bladder incontinence, dysuria and flank pain.  Musculoskeletal: Positive for back pain. Negative for gait problem.  Skin: Negative.   Neurological: Negative for dizziness, tingling, weakness, numbness and paresthesias.    Allergies  Ibuprofen and Ultram  Home Medications   Current Outpatient Rx  Name Route Sig Dispense Refill  . ASPIRIN 325 MG PO TABS Oral Take 325 mg by mouth daily.    Marland Kitchen LIDOCAINE 5 % EX PTCH Transdermal Place 1 patch onto the skin daily as needed. For pain on back    . LISINOPRIL 10 MG PO TABS Oral Take 1 tablet (10 mg total) by mouth daily. 30 tablet 2  . LORATADINE 10 MG PO TABS Oral Take 10 mg by mouth daily.    Marland Kitchen PSEUDOEPHEDRINE HCL 60 MG PO TABS Oral Take 60 mg by mouth every 4 (four) hours as needed. For congestion...allergy symptons      BP 158/107  Pulse 87  Temp 98 F (36.7 C) (Oral)  Resp 18  SpO2 98%  Physical Exam  Nursing note and vitals reviewed. Constitutional: He is oriented to person, place, and time. He appears well-developed and well-nourished. No distress.  Eyes: Conjunctivae are normal.  Neck: Neck supple.  Cardiovascular: Normal rate,  regular rhythm and normal heart sounds.   Pulmonary/Chest: Effort normal and breath sounds normal. No respiratory distress. He has no wheezes. He has no rales.  Musculoskeletal:       Tender to palpation over lumbar and para lumbar area, extending into sacrum. Pain with left straight leg raise  Neurological: He is alert and oriented to person, place, and time.       2+ and equal patellar reflexes. 5/5 and equal LE strength. Pt able to dorsiflex bilateral feet and toes.   Skin: Skin is warm and dry.  Psychiatric: He has a normal mood and affect.    ED Course  Procedures (including critical care time)  Chronic  back pain. Notes in the computer reviewed, pt was recently dismissed from pain management due to positive cocaine in urine. Even when asked if pt recently had any treatment he denied. i did not confront him, but i believe pt is not being completely honest. He has no red flags to suggest cauda equina. Will treat with pain medications and follow up as scheduled.   1. Chronic back pain       MDM          Lottie Mussel, PA 07/22/11 1536

## 2011-07-23 NOTE — ED Provider Notes (Signed)
Medical screening examination/treatment/procedure(s) were performed by non-physician practitioner and as supervising physician I was immediately available for consultation/collaboration.   Fredna Stricker E Kesa Birky, MD 07/23/11 0731 

## 2011-07-29 ENCOUNTER — Ambulatory Visit: Payer: Self-pay | Admitting: Pain Medicine

## 2011-08-11 ENCOUNTER — Ambulatory Visit: Payer: Self-pay | Admitting: Pain Medicine

## 2011-08-26 ENCOUNTER — Ambulatory Visit: Payer: Self-pay | Admitting: Pain Medicine

## 2011-09-08 ENCOUNTER — Ambulatory Visit: Payer: Self-pay | Admitting: Pain Medicine

## 2011-09-28 ENCOUNTER — Encounter (HOSPITAL_COMMUNITY): Payer: Self-pay | Admitting: Family Medicine

## 2011-09-28 ENCOUNTER — Emergency Department (HOSPITAL_COMMUNITY)
Admission: EM | Admit: 2011-09-28 | Discharge: 2011-09-28 | Disposition: A | Discharge status: Home / Self Care | Payer: Medicaid Other | Attending: Emergency Medicine | Admitting: Emergency Medicine

## 2011-09-28 DIAGNOSIS — I10 Essential (primary) hypertension: Secondary | ICD-10-CM | POA: Insufficient documentation

## 2011-09-28 DIAGNOSIS — M549 Dorsalgia, unspecified: Secondary | ICD-10-CM

## 2011-09-28 DIAGNOSIS — M62838 Other muscle spasm: Secondary | ICD-10-CM | POA: Insufficient documentation

## 2011-09-28 DIAGNOSIS — G8929 Other chronic pain: Secondary | ICD-10-CM | POA: Insufficient documentation

## 2011-09-28 MED ORDER — DIAZEPAM 5 MG/ML IJ SOLN
10.0000 mg | Freq: Once | INTRAMUSCULAR | Status: AC
  Administered 2011-09-28: 10 mg via INTRAMUSCULAR
  Filled 2011-09-28: qty 2

## 2011-09-28 MED ORDER — OXYCODONE-ACETAMINOPHEN 10-325 MG PO TABS: 1.0000 | ORAL_TABLET | ORAL | Status: AC | PRN

## 2011-09-28 MED ORDER — DIAZEPAM 5 MG PO TABS: 5.0000 mg | ORAL_TABLET | Freq: Four times a day (QID) | ORAL | Status: AC | PRN

## 2011-09-28 MED ORDER — HYDROMORPHONE HCL PF 1 MG/ML IJ SOLN
1.0000 mg | Freq: Once | INTRAMUSCULAR | Status: AC
  Administered 2011-09-28: 1 mg via INTRAMUSCULAR
  Filled 2011-09-28: qty 1

## 2011-09-28 NOTE — ED Notes (Signed)
Pt reports injured lower back Friday night going upstairs. Reports chronic lower back pain.

## 2011-09-28 NOTE — ED Provider Notes (Signed)
History     CSN: [id number]  Arrival date & time 09/28/11  1749   First MD Initiated Contact with Patient 09/28/11 1800      Chief Complaint  Patient presents with  . Back Injury   HPI  History provided by the patient. Patient is a 41 year old male with history of hypertension, spinal compression fractures and chronic back pain who presents with complaints of worsening low back pain. Patient states he had a sudden increase in low back pain while coming up porch steps. Patient was twisting slightly but states that he was stepping and pushing of his right leg me a sudden burning pains in his low back. This dropped to his knees. He was still able to get up and walk but has had increasing pain since that time. Patient states that he has been seen by Dr. Magnus Ivan with orthopedics in the past and discussed back pain. He had MRI performed earlier this year. Patient also states he has scheduled followup with Dr. Danielle Dess with neurosurgery later this month. Patient is currently being treated with hydrocodone 10 mg by Dr. Danielle Dess for pains. He took 2 pills last evening without significant relief. He called the office today and was instructed to come to emergency room for additional treatment of back pain. He denies any radiation of pain. Pain is primarily in low back there is no numbness weakness in lower extremities. No urinary or fecal incontinence, urinary retention or perineal numbness.     Past Medical History  Diagnosis Date  . Hypertension   . Back pain, chronic     Past Surgical History  Procedure Date  . Knee arthroscopy   . Hand surgery   . Tonsillectomy     Family History  Problem Relation Age of Onset  . Cancer Mother     History  Substance Use Topics  . Smoking status: Never Smoker   . Smokeless tobacco: Not on file  . Alcohol Use: 3.0 oz/week    5 Cans of beer per week     occasionally      Review of Systems  HENT: Negative for neck pain.   Musculoskeletal: Positive  for back pain.  Neurological: Negative for weakness and numbness.    Allergies  Ibuprofen and Ultram  Home Medications   Current Outpatient Rx  Name Route Sig Dispense Refill  . ASPIRIN EC 81 MG PO TBEC Oral Take 81 mg by mouth daily.    Marland Kitchen LISINOPRIL 10 MG PO TABS Oral Take 1 tablet (10 mg total) by mouth daily. 30 tablet 2  . LORATADINE 10 MG PO TABS Oral Take 10 mg by mouth daily.      BP 135/82  Pulse 82  Temp 97.9 F (36.6 C) (Oral)  Resp 18  SpO2 97%  Physical Exam  Nursing note and vitals reviewed. Constitutional: He is oriented to person, place, and time. He appears well-developed and well-nourished. No distress.  HENT:  Head: Normocephalic.  Cardiovascular: Normal rate and regular rhythm.   Pulmonary/Chest: Breath sounds normal. No respiratory distress. He has no wheezes.  Abdominal: Soft.       No CVA tenderness  Musculoskeletal: He exhibits no edema and no tenderness.       Cervical back: Normal.       Thoracic back: Normal.       Lumbar back: He exhibits decreased range of motion and tenderness.       Back:  Neurological: He is alert and oriented to person, place, and time.  He has normal strength. No sensory deficit.  Reflex Scores:      Bicep reflexes are 2+ on the right side and 2+ on the left side.      Patient walks with flexion of low back and uses cane.    ED Course  Procedures     1. Back pain   2. Muscle spasm       MDM  6:40PM patient seen and evaluated. Patient with history of chronic pains. No significant new injury or trauma. No red flag symptoms.  Patient given an IM dose of hydromorphone 1 mg and Valium 10 mg. Prescriptions for Percocet 10 mg and Valium 5 mg also provided.  Patient will followup with Dr. Danielle Dess tomorrow for additional followup.        Angus Seller, [place] 09/28/11 1919

## 2011-10-02 NOTE — ED Provider Notes (Signed)
Medical screening examination/treatment/procedure(s) were performed by non-physician practitioner and as supervising physician I was immediately available for consultation/collaboration.  Jones Skene, M.D.     Jones Skene, MD 10/02/11 1648

## 2011-10-07 ENCOUNTER — Ambulatory Visit: Payer: Self-pay | Admitting: Pain Medicine

## 2011-10-27 IMAGING — CR DG LUMBAR SPINE COMPLETE 4+V
5 series · 5 of 5 positions shown · non-contrast
Comparison: 11/21/2008.

CLINICAL DATA: History of lifting injury.  Back pain.

LUMBAR SPINE - COMPLETE 4+ VIEW

[t l-spine a.p.]
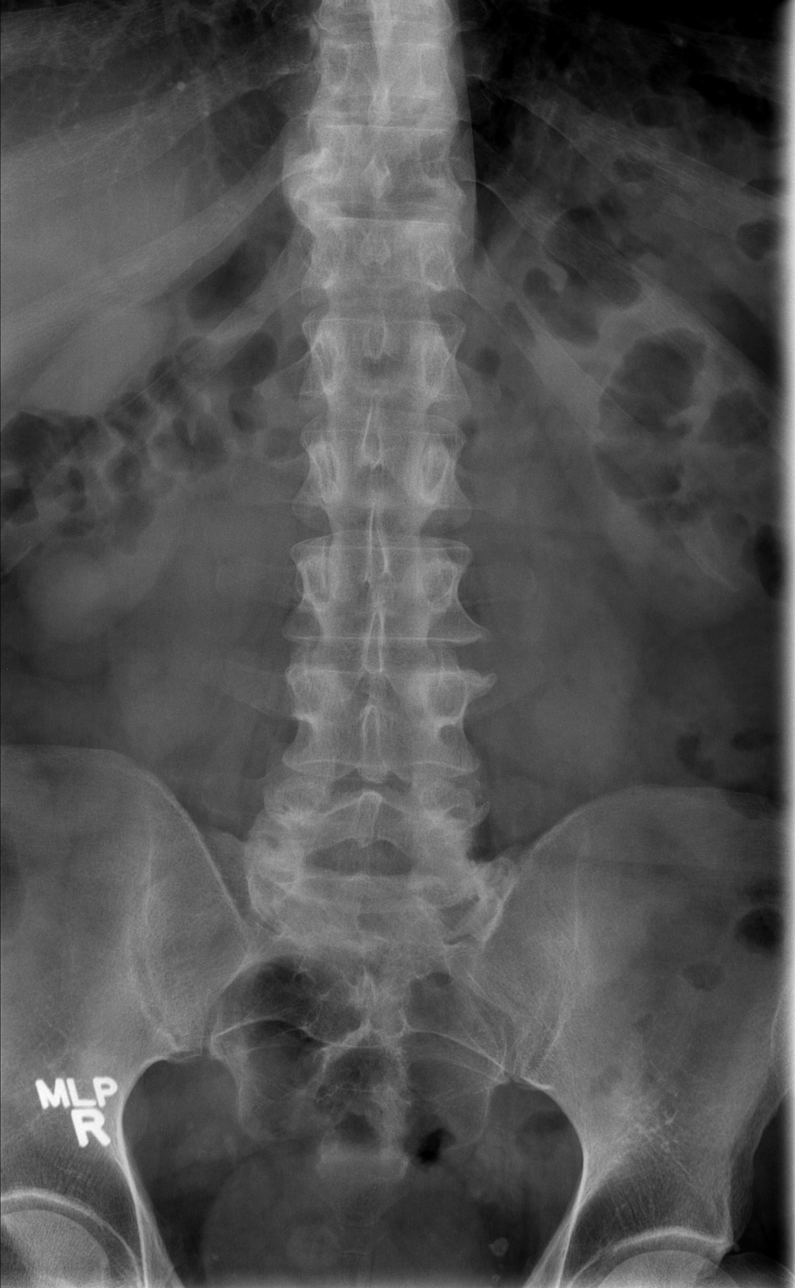

[t l-spine oblique exposure (1 of 2)]
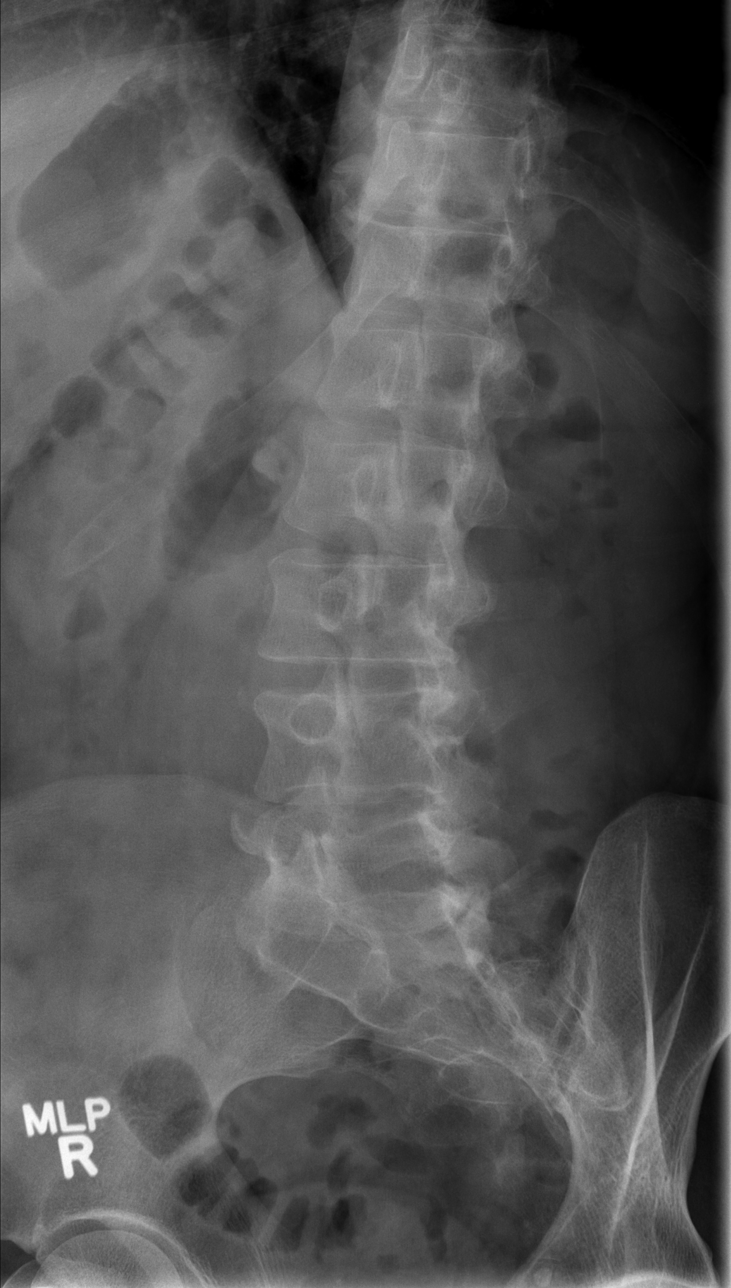

[t l-spine oblique exposure (2 of 2)]
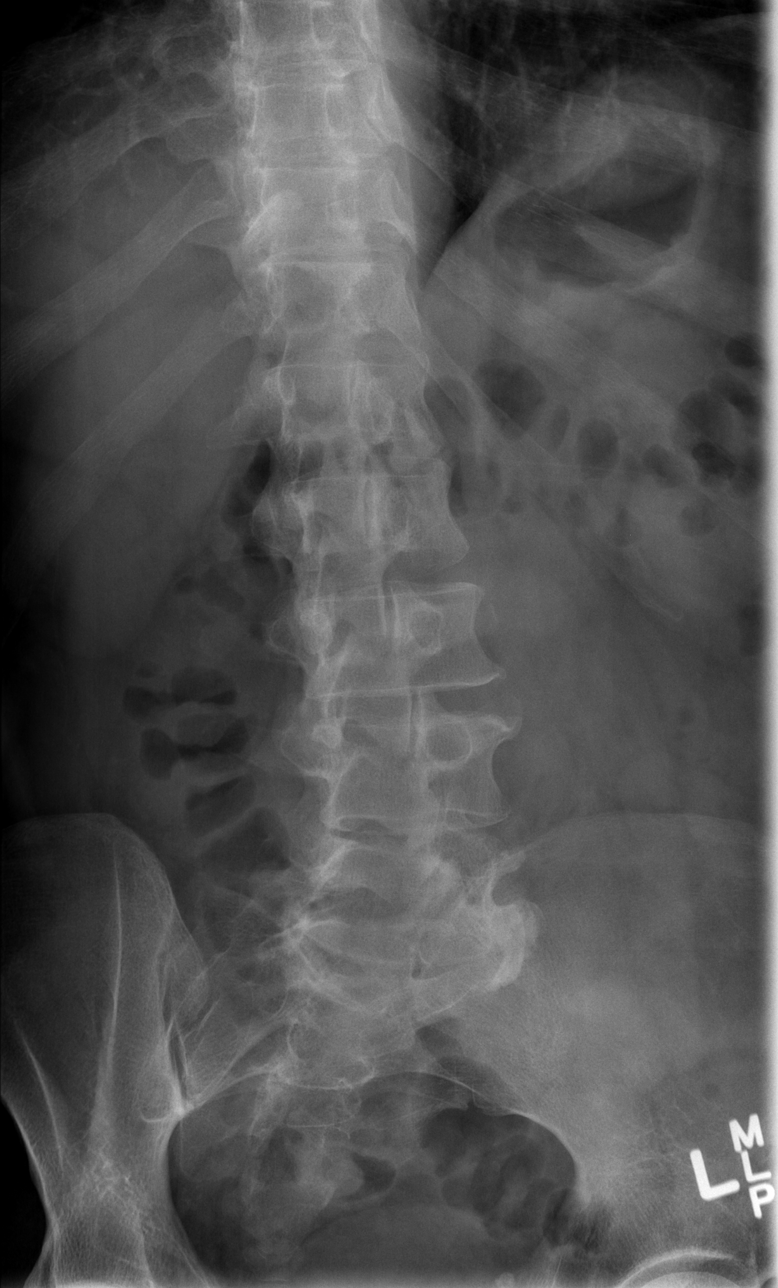

[t l-spine lat]
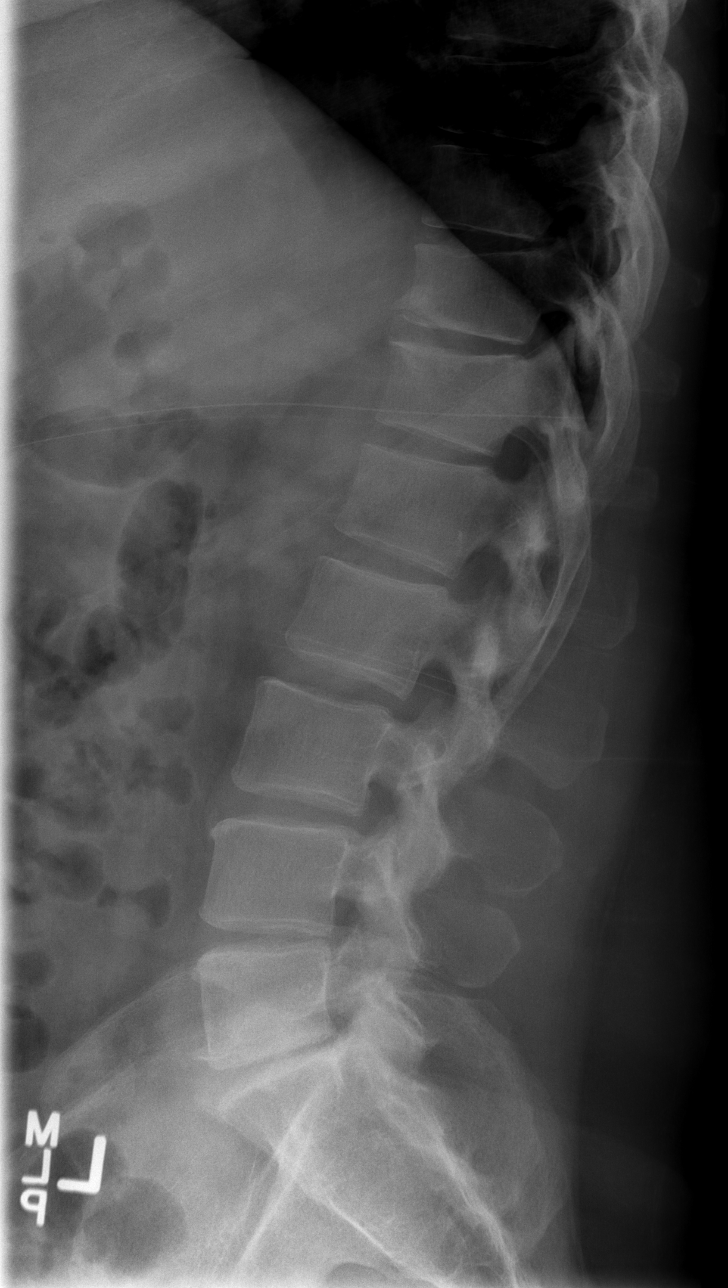

[t l-spine l5-s1 spot]
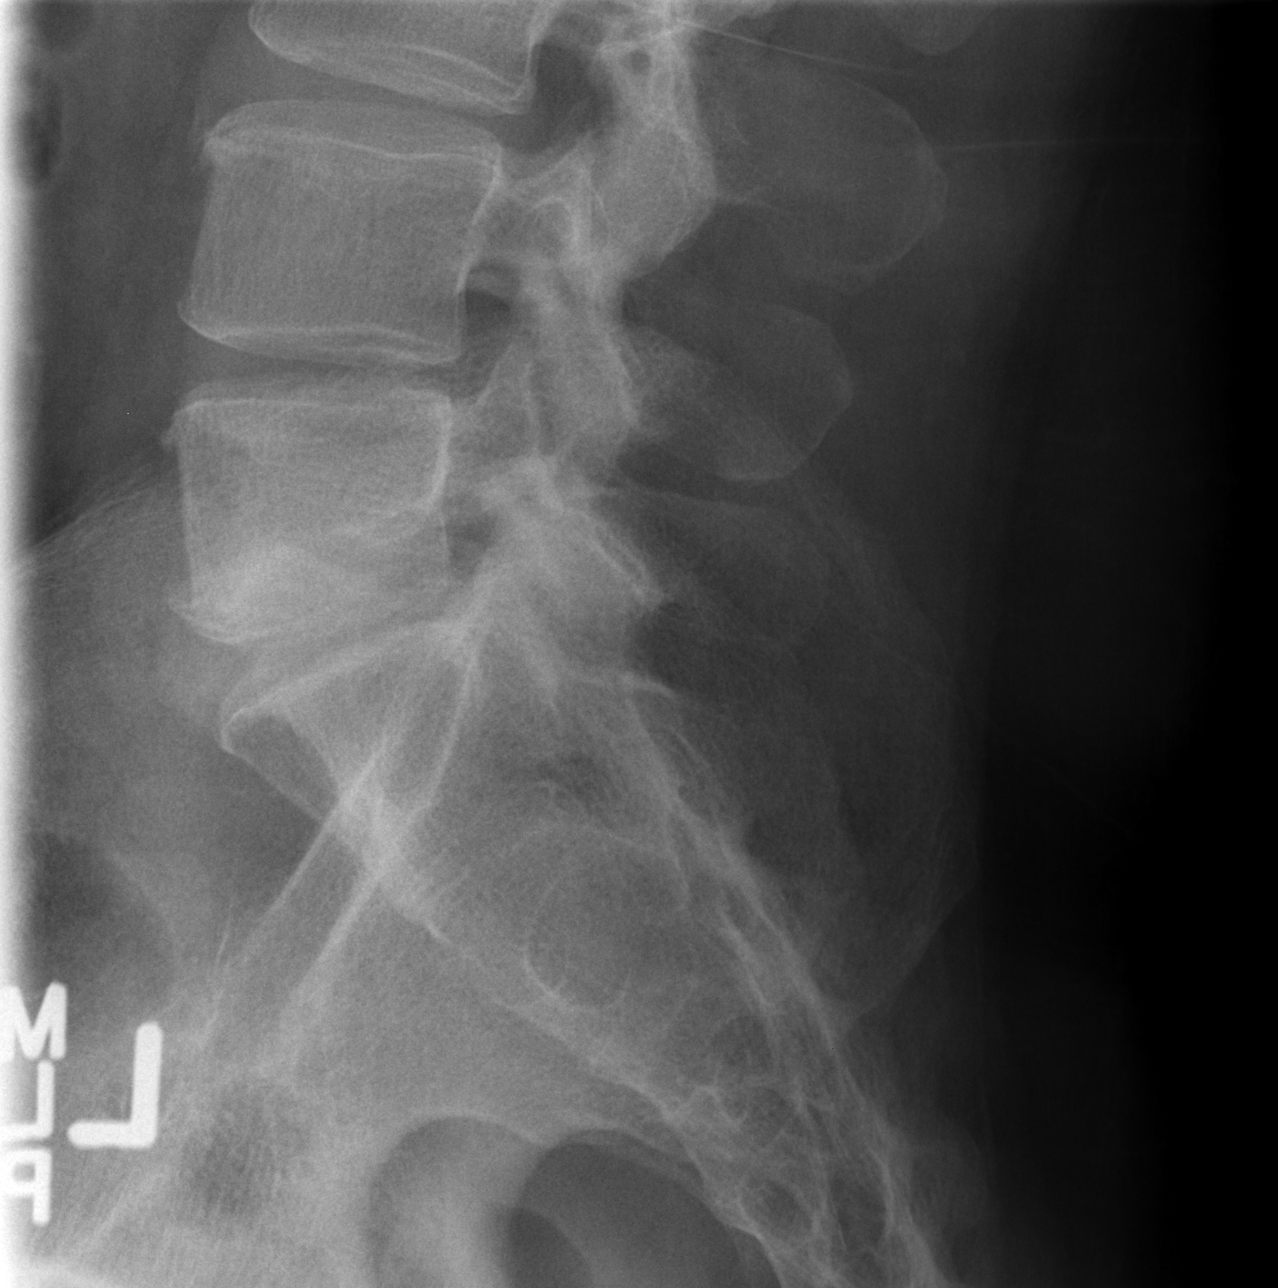

[5 of 5 positions shown; findings below may reference images not displayed]

FINDINGS: There is narrowing of the intervertebral disc spaces at
the levels of L4-L5 and L5-S1.  There is marginal osteophyte
formation representing degenerative spondylosis.  There is no
evidence of acute fracture.  No subluxation or dislocation is seen.
No pars defects are seen.  There is apophyseal joint
osteoarthropathy at the level of L5-S1.  Slight depression of the
superior endplate of the T12 vertebral body is stable compared to
previous study.
IMPRESSION: No acute process is evident.  Changes of degenerative disc disease
and degenerative spondylosis are seen.  Old compression of superior
endplate of T12 vertebral body appears stable.

## 2011-11-04 ENCOUNTER — Ambulatory Visit: Payer: Self-pay | Admitting: Pain Medicine

## 2011-11-24 ENCOUNTER — Ambulatory Visit: Payer: Self-pay | Admitting: Pain Medicine

## 2011-12-14 ENCOUNTER — Ambulatory Visit: Payer: Self-pay | Admitting: Pain Medicine

## 2012-01-05 ENCOUNTER — Ambulatory Visit: Payer: Self-pay | Admitting: Pain Medicine

## 2012-02-02 ENCOUNTER — Ambulatory Visit: Payer: Self-pay | Admitting: Pain Medicine

## 2012-03-01 ENCOUNTER — Ambulatory Visit: Payer: Self-pay | Admitting: Pain Medicine

## 2012-03-30 ENCOUNTER — Ambulatory Visit: Payer: Self-pay | Admitting: Pain Medicine

## 2012-04-27 ENCOUNTER — Ambulatory Visit: Payer: Self-pay | Admitting: Pain Medicine

## 2012-05-03 ENCOUNTER — Ambulatory Visit (INDEPENDENT_AMBULATORY_CARE_PROVIDER_SITE_OTHER): Payer: Medicaid Other | Admitting: Podiatry

## 2012-05-03 ENCOUNTER — Encounter: Payer: Self-pay | Admitting: Podiatry

## 2012-05-03 VITALS — BP 148/91 | HR 71 | Ht 72.0 in | Wt 253.0 lb

## 2012-05-03 DIAGNOSIS — M216X9 Other acquired deformities of unspecified foot: Secondary | ICD-10-CM

## 2012-05-03 DIAGNOSIS — M24576 Contracture, unspecified foot: Secondary | ICD-10-CM

## 2012-05-03 DIAGNOSIS — G609 Hereditary and idiopathic neuropathy, unspecified: Secondary | ICD-10-CM

## 2012-05-03 DIAGNOSIS — M79609 Pain in unspecified limb: Secondary | ICD-10-CM

## 2012-05-03 DIAGNOSIS — M25579 Pain in unspecified ankle and joints of unspecified foot: Secondary | ICD-10-CM | POA: Insufficient documentation

## 2012-05-03 DIAGNOSIS — M21969 Unspecified acquired deformity of unspecified lower leg: Secondary | ICD-10-CM

## 2012-05-03 DIAGNOSIS — M79673 Pain in unspecified foot: Secondary | ICD-10-CM

## 2012-05-03 MED ORDER — OXYCODONE HCL 10 MG PO TB12: 10.0000 mg | ORAL_TABLET | Freq: Three times a day (TID) | ORAL | Status: DC

## 2012-05-03 NOTE — Patient Instructions (Addendum)
Seen for pain on bottom of feet L>R x 6-8 months. Noted some weakness at the first Tarsometatarsal joint left, which can cause weight shift to the side of foot during weight bearing. If there were any problem in lower back, similar symptoms can also manifest.  Re-balancing the medial column (Inserting a wedge shape of bone through the first Cuneiform bone, called Cotton osteotomy with bone graft) may improve the forefoot balancing with no guarantee that the symptoms would subside if the pain was caused by lower back problem. The surgery would also require 6 weeks in cast and another few weeks in a boot. Typically takes 2 months for recovery.  During this recovery time, no physical work would be allowed.  Meantime, custom Orthotics can benefit.  Continue with current medication as needed (10mg  Oxycodone q 8 hr as needed). Return for further discussion or as needed.
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
## 2012-05-04 ENCOUNTER — Telehealth: Payer: Self-pay | Admitting: Podiatry

## 2012-05-04 MED ORDER — OXYCODONE HCL 15 MG PO TABS: 15.0000 mg | ORAL_TABLET | Freq: Three times a day (TID) | ORAL | Status: DC | PRN

## 2012-05-04 NOTE — Telephone Encounter (Signed)
Pt called this afternoon and said [facility] did not cover Oxycodone Rx. Can he bring this rx back and get one for Roxicet??

## 2012-05-04 NOTE — Addendum Note (Signed)
Addended by: Charlett Nose on: 05/04/2012 04:50 PM   Modules accepted: Orders

## 2012-05-15 ENCOUNTER — Ambulatory Visit (INDEPENDENT_AMBULATORY_CARE_PROVIDER_SITE_OTHER): Payer: Medicaid Other | Admitting: Podiatry

## 2012-05-15 VITALS — Ht 72.0 in | Wt 250.0 lb

## 2012-05-15 DIAGNOSIS — M25579 Pain in unspecified ankle and joints of unspecified foot: Secondary | ICD-10-CM

## 2012-05-15 DIAGNOSIS — M21969 Unspecified acquired deformity of unspecified lower leg: Secondary | ICD-10-CM | POA: Insufficient documentation

## 2012-05-15 DIAGNOSIS — M25572 Pain in left ankle and joints of left foot: Secondary | ICD-10-CM

## 2012-05-15 DIAGNOSIS — M216X9 Other acquired deformities of unspecified foot: Secondary | ICD-10-CM

## 2012-05-15 DIAGNOSIS — M21962 Unspecified acquired deformity of left lower leg: Secondary | ICD-10-CM
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
## 2012-05-17 ENCOUNTER — Ambulatory Visit: Payer: Medicaid Other | Admitting: Podiatry

## 2012-05-25 DIAGNOSIS — Q666 Other congenital valgus deformities of feet: Secondary | ICD-10-CM

## 2012-05-25 HISTORY — PX: OTHER SURGICAL HISTORY: SHX169

## 2012-05-26 ENCOUNTER — Telehealth: Payer: Self-pay | Admitting: Podiatry

## 2012-05-26 NOTE — Telephone Encounter (Signed)
Pt states he is having pain,Is there  anything else in conjuntion  With rx already given,

## 2012-05-30 ENCOUNTER — Encounter: Payer: Self-pay | Admitting: Podiatry

## 2012-05-30 ENCOUNTER — Ambulatory Visit (INDEPENDENT_AMBULATORY_CARE_PROVIDER_SITE_OTHER): Payer: Medicaid Other | Admitting: Podiatry

## 2012-05-30 VITALS — BP 125/93 | HR 82 | Temp 98.9°F

## 2012-05-30 DIAGNOSIS — Z9889 Other specified postprocedural states: Secondary | ICD-10-CM

## 2012-05-30 NOTE — Progress Notes (Signed)
Subjective:  Status post 5 days from Cotton osteotomy with graft left foot. Came in a casted left foot aided by a pair of crutches. Stated that he does not get any pain medication from the pain management clinic since he is under post op care. Taking Oxycodone 15 mg q 6-8 hrs now.  Objective:  Cast is clean and dry. All digits are in normal color and able to wiggle. No abnormal pain or discomfort other than the normal post op pain at surgery site.  Assessment:  Normal post op progress and stable condition.   Plan: Advised to do range of motion exercise on the left lower limb by bending and extending at the knee joint. Use pain medication only if needed. Advised not to exceed 3 a day. Return in 3 weeks from the day of surgery to replace cast.

## 2012-05-31 ENCOUNTER — Ambulatory Visit: Payer: Self-pay | Admitting: Pain Medicine

## 2012-06-07 ENCOUNTER — Telehealth: Payer: Self-pay | Admitting: Podiatry

## 2012-06-07 MED ORDER — OXYCODONE HCL 15 MG PO TABS: 15.0000 mg | ORAL_TABLET | Freq: Three times a day (TID) | ORAL | Status: DC | PRN

## 2012-06-07 NOTE — Telephone Encounter (Signed)
Patient called this am requesting Refill for Oxycodone, says he only has a few left.

## 2012-06-14 ENCOUNTER — Encounter: Payer: Self-pay | Admitting: Podiatry

## 2012-06-14 ENCOUNTER — Ambulatory Visit (INDEPENDENT_AMBULATORY_CARE_PROVIDER_SITE_OTHER): Payer: Medicaid Other | Admitting: Podiatry

## 2012-06-14 VITALS — BP 139/86 | HR 64

## 2012-06-14 DIAGNOSIS — M21962 Unspecified acquired deformity of left lower leg: Secondary | ICD-10-CM

## 2012-06-14 DIAGNOSIS — M216X9 Other acquired deformities of unspecified foot: Secondary | ICD-10-CM

## 2012-06-14 DIAGNOSIS — M21969 Unspecified acquired deformity of unspecified lower leg: Secondary | ICD-10-CM

## 2012-06-14 DIAGNOSIS — M25572 Pain in left ankle and joints of left foot: Secondary | ICD-10-CM

## 2012-06-14 DIAGNOSIS — M25579 Pain in unspecified ankle and joints of unspecified foot: Secondary | ICD-10-CM

## 2012-06-14 DIAGNOSIS — M79609 Pain in unspecified limb: Secondary | ICD-10-CM

## 2012-06-14 MED ORDER — HYDROCODONE-ACETAMINOPHEN 10-325 MG PO TABS: 1.0000 | ORAL_TABLET | Freq: Four times a day (QID) | ORAL | Status: DC | PRN

## 2012-06-14 NOTE — Progress Notes (Signed)
Subjective:  3 weeks post op. Walking in casted foot with aid of crutches. Stated that current pain medication was up setting his stomach and not taking now. He found Hydrocodone 10 mg tab was more effective and in use now. Was walking with cast without problem. Stated that he is not able to get pain medication for his back till he is released from foot care.  Objective:  Cast is intact. All digits are able to move well and normal color. Cast removed. Incision line is clean and dry with good approximated skin edges. X-rays taken. Grafted bone is in place. No sign of displacement or abnormal changes in X-ray. Assessment: Status post 3 weeks Cotton osteotomy with bone graft left foot with satisfactory progress. Plan: Left foot placed back in cast to allow semi weight bearing. Cast boot dispensed. Advised to walk minimum, range of motion exercise on knee, and elevate frequently. Rx. For Hydrocodone 10/325 written as requested.  Return in 3 weeks.

## 2012-06-20 ENCOUNTER — Ambulatory Visit: Payer: Self-pay | Admitting: Pain Medicine

## 2012-06-29 ENCOUNTER — Telehealth: Payer: Self-pay | Admitting: Podiatry

## 2012-06-29 NOTE — Telephone Encounter (Signed)
Patient called and said he is still in a lot of pain, Requested a rx for a wheelchair  for [facility].

## 2012-06-30 ENCOUNTER — Telehealth: Payer: Self-pay | Admitting: Podiatry

## 2012-06-30 NOTE — Telephone Encounter (Signed)
We will contact pain management and have them to manage his pain pills.

## 2012-06-30 NOTE — Telephone Encounter (Signed)
Informed to decrease activity.

## 2012-06-30 NOTE — Telephone Encounter (Signed)
Dr. Burgess Estelle I received a message from [facility] @ Dr. Ewing Schlein Office (Pain Management)  Regarding [name] [name]. She just wanted to know we are aware that  Mr. Salado sees them once a month and does get narcotics from them also. I missed her message yesterday and returned her call and she has called this am 06/30/2012 to confirm this message.

## 2012-07-05 ENCOUNTER — Encounter: Payer: Self-pay | Admitting: Podiatry

## 2012-07-05 ENCOUNTER — Ambulatory Visit (INDEPENDENT_AMBULATORY_CARE_PROVIDER_SITE_OTHER): Payer: Medicaid Other | Admitting: Podiatry

## 2012-07-05 VITALS — BP 138/90 | HR 88

## 2012-07-05 DIAGNOSIS — M216X9 Other acquired deformities of unspecified foot: Secondary | ICD-10-CM

## 2012-07-05 DIAGNOSIS — Z9889 Other specified postprocedural states: Secondary | ICD-10-CM

## 2012-07-05 DIAGNOSIS — M21962 Unspecified acquired deformity of left lower leg: Secondary | ICD-10-CM

## 2012-07-05 DIAGNOSIS — M25572 Pain in left ankle and joints of left foot: Secondary | ICD-10-CM

## 2012-07-05 DIAGNOSIS — M79609 Pain in unspecified limb: Secondary | ICD-10-CM

## 2012-07-05 DIAGNOSIS — M21969 Unspecified acquired deformity of unspecified lower leg: Secondary | ICD-10-CM

## 2012-07-05 DIAGNOSIS — M25579 Pain in unspecified ankle and joints of unspecified foot: Secondary | ICD-10-CM

## 2012-07-05 MED ORDER — OXYCODONE HCL 15 MG PO TABS: 15.0000 mg | ORAL_TABLET | ORAL | Status: DC | PRN

## 2012-07-05 NOTE — Progress Notes (Signed)
Subjective: Patient came in for post op care.  6 weeks post op. Status post Cotton osteotomy left foot. Cast is clean. He has not put weight on the foot. Been using crutches.  Stated that he was to receive pain medication till July 5th, which is his next appointment with pain management doctor. After that the pain management doctor will manage his pain control.   Objective: Cast removed.  Surgical wound is well coapted. No visible edema or erythema noted. X-ray left foot done. Graft position is good. Positive sign of bone healing.  No abnormal findings.  Assessment: Normal post op progress since Cotton osteotomy with graft left foot.  Plan: Left foot placed in CAM walker. Patient is to continue with crutches and walk with half weight on the foot.  Do range of motion exercise on the left ankle.  Rx. Written for Roxicodone 15 mg #75 to take one q 4-6 hr prn pain. Return in 3 weeks.

## 2012-07-19 ENCOUNTER — Ambulatory Visit: Payer: Self-pay | Admitting: Pain Medicine

## 2012-07-26 ENCOUNTER — Ambulatory Visit (INDEPENDENT_AMBULATORY_CARE_PROVIDER_SITE_OTHER): Payer: Medicaid Other | Admitting: Podiatry

## 2012-07-26 DIAGNOSIS — M25572 Pain in left ankle and joints of left foot: Secondary | ICD-10-CM

## 2012-07-26 DIAGNOSIS — M25579 Pain in unspecified ankle and joints of unspecified foot: Secondary | ICD-10-CM

## 2012-07-26 MED ORDER — OXYCODONE HCL 15 MG PO TABS: 15.0000 mg | ORAL_TABLET | Freq: Four times a day (QID) | ORAL | Status: DC | PRN
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
## 2012-08-16 ENCOUNTER — Ambulatory Visit: Payer: Self-pay | Admitting: Pain Medicine

## 2013-02-17 ENCOUNTER — Emergency Department (HOSPITAL_COMMUNITY): Payer: Medicaid Other

## 2013-02-17 ENCOUNTER — Encounter (HOSPITAL_COMMUNITY): Payer: Self-pay | Admitting: Emergency Medicine

## 2013-02-17 ENCOUNTER — Emergency Department (HOSPITAL_COMMUNITY)
Admission: EM | Admit: 2013-02-17 | Discharge: 2013-02-18 | Disposition: A | Discharge status: Home / Self Care | Payer: Medicaid Other | Attending: Emergency Medicine | Admitting: Emergency Medicine

## 2013-02-17 DIAGNOSIS — Z79899 Other long term (current) drug therapy: Secondary | ICD-10-CM | POA: Insufficient documentation

## 2013-02-17 DIAGNOSIS — Z7982 Long term (current) use of aspirin: Secondary | ICD-10-CM | POA: Insufficient documentation

## 2013-02-17 DIAGNOSIS — I10 Essential (primary) hypertension: Secondary | ICD-10-CM | POA: Insufficient documentation

## 2013-02-17 DIAGNOSIS — S0990XA Unspecified injury of head, initial encounter: Secondary | ICD-10-CM | POA: Insufficient documentation

## 2013-02-17 DIAGNOSIS — G8929 Other chronic pain: Secondary | ICD-10-CM | POA: Insufficient documentation

## 2013-02-17 DIAGNOSIS — S60229A Contusion of unspecified hand, initial encounter: Secondary | ICD-10-CM
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
## 2013-02-17 NOTE — ED Notes (Addendum)
Pt's BP noted to be elevated upon arrival.  Manual BP 142/98.  Per pt his diastolic has been in the 100's recently and pt's PCP has increased his lisinopril from 10 mg to 20 mg daily.  Dr. Rubin PayorPickering notified.
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
## 2013-02-18 MED ORDER — OXYCODONE HCL 20 MG PO TABS: 1.0000 | ORAL_TABLET | Freq: Every day | ORAL | Status: DC

## 2013-02-18 NOTE — Discharge Instructions (Signed)
Concussion, Adult °A concussion, or closed-head injury, is a brain injury caused by a direct blow to the head or by a quick and sudden movement (jolt) of the head or neck. Concussions are usually not life-threatening. Even so, the effects of a concussion can be serious. If you have had a concussion before, you are more likely to experience concussion-like symptoms after a direct blow to the head.  °CAUSES  °· Direct blow to the head, such as from running into another player during a soccer game, being hit in a fight, or hitting your head on a hard surface. °· A jolt of the head or neck that causes the brain to move back and forth inside the skull, such as in a car crash. °SIGNS AND SYMPTOMS  °The signs of a concussion can be hard to notice. Early on, they may be missed by you, family members, and health care providers. You may look fine but act or feel differently. °Symptoms are usually temporary, but they may last for days, weeks, or even longer. Some symptoms may appear right away while others may not show up for hours or days. Every head injury is different. Symptoms include:  °· Mild to moderate headaches that will not go away. °· A feeling of pressure inside your head.  °· Having more trouble than usual:   °· Learning or remembering things you have heard. °· Answering questions.  °· Paying attention or concentrating.   °· Organizing daily tasks.   °· Making decisions and solving problems.   °· Slowness in thinking, acting or reacting, speaking, or reading.   °· Getting lost or being easily confused.   °· Feeling tired all the time or lacking energy (fatigued).   °· Feeling drowsy.   °· Sleep disturbances.   °· Sleeping more than usual.   °· Sleeping less than usual.   °· Trouble falling asleep.   °· Trouble sleeping (insomnia).   °· Loss of balance or feeling lightheaded or dizzy.   °· Nausea or vomiting.   °· Numbness or tingling.   °· Increased sensitivity to:   °· Sounds.   °· Lights.   °· Distractions.    °· Vision problems or eyes that tire easily.   °· Diminished sense of taste or smell.   °· Ringing in the ears.   °· Mood changes such as feeling sad or anxious.   °· Becoming easily irritated or angry for little or no reason.   °· Lack of motivation. °· Seeing or hearing things other people do not see or hear (hallucinations). °DIAGNOSIS  °Your health care provider can usually diagnose a concussion based on a description of your injury and symptoms. He or she will ask whether you passed out (lost consciousness) and whether you are having trouble remembering events that happened right before and during your injury.  °Your evaluation might include:  °· A brain scan to look for signs of injury to the brain. Even if the test shows no injury, you may still have a concussion.   °· Blood tests to be sure other problems are not present. °TREATMENT  °· Concussions are usually treated in an emergency department, in urgent care, or at a clinic. You may need to stay in the hospital overnight for further treatment.   °· Tell your health care provider if you are taking any medicines, including prescription medicines, over-the-counter medicines, and natural remedies. Some medicines, such as blood thinners (anticoagulants) and aspirin, may increase the chance of complications. Also tell your health care provider whether you have had alcohol or are taking illegal drugs. This information may affect treatment. °· Your health care provider will send you   home with important instructions to follow. °· How fast you will recover from a concussion depends on many factors. These factors include how severe your concussion is, what part of your brain was injured, your age, and how healthy you were before the concussion. °· Most people with mild injuries recover fully. Recovery can take time. In general, recovery is slower in older persons. Also, persons who have had a concussion in the past or have other medical problems may find that it  takes longer to recover from their current injury. °HOME CARE INSTRUCTIONS  °General Instructions °· Carefully follow the directions your health care provider gave you. °· Only take over-the-counter or prescription medicines for pain, discomfort, or fever as directed by your health care provider. °· Take only those medicines that your health care provider has approved. °· Do not drink alcohol until your health care provider says you are well enough to do so. Alcohol and certain other drugs may slow your recovery and can put you at risk of further injury. °· If it is harder than usual to remember things, write them down. °· If you are easily distracted, try to do one thing at a time. For example, do not try to watch TV while fixing dinner. °· Talk with family members or close friends when making important decisions. °· Keep all follow-up appointments. Repeated evaluation of your symptoms is recommended for your recovery. °· Watch your symptoms and tell others to do the same. Complications sometimes occur after a concussion. Older adults with a brain injury may have a higher risk of serious complications such as of a blood clot on the brain. °· Tell your teachers, school nurse, school counselor, coach, athletic trainer, or work manager about your injury, symptoms, and restrictions. Tell them about what you can or cannot do. They should watch for:   °· Increased problems with attention or concentration.   °· Increased difficulty remembering or learning new information.   °· Increased time needed to complete tasks or assignments.   °· Increased irritability or decreased ability to cope with stress.   °· Increased symptoms.   °· Rest. Rest helps the brain to heal. Make sure you: °· Get plenty of sleep at night. Avoid staying up late at night. °· Keep the same bedtime hours on weekends and weekdays. °· Rest during the day. Take daytime naps or rest breaks when you feel tired. °· Limit activities that require a lot of  thought or concentration. These includes   °· Doing homework or job-related work.   °· Watching TV.   °· Working on the computer. °· Avoid any situation where there is potential for another head injury (football, hockey, soccer, basketball, martial arts, downhill snow sports and horseback riding). Your condition will get worse every time you experience a concussion. You should avoid these activities until you are evaluated by the appropriate follow-up caregivers. °Returning To Your Regular Activities °You will need to return to your normal activities slowly, not all at once. You must give your body and brain enough time for recovery. °· Do not return to sports or other athletic activities until your health care provider tells you it is safe to do so. °· Ask your health care provider when you can drive, ride a bicycle, or operate heavy machinery. Your ability to react may be slower after a brain injury. Never do these activities if you are dizzy. °· Ask your health care provider about when you can return to work or school. °Preventing Another Concussion °It is very important to avoid another   brain injury, especially before you have recovered. In rare cases, another injury can lead to permanent brain damage, brain swelling, or death. The risk of this is greatest during the first 7 10 days after a head injury. Avoid injuries by:  °· Wearing a seat belt when riding in a car.   °· Drinking alcohol only in moderation.   °· Wearing a helmet when biking, skiing, skateboarding, skating, or doing similar activities. °· Avoiding activities that could lead to a second concussion, such as contact or recreational sports, until your health care provider says it is OK. °· Taking safety measures in your home.   °· Remove clutter and tripping hazards from floors and stairways.   °· Use grab bars in bathrooms and handrails by stairs.   °· Place non-slip mats on floors and in bathtubs.   °· Improve lighting in dim areas. °SEEK MEDICAL  CARE IF:  °· You have increased problems paying attention or concentrating.   °· You have increased difficulty remembering or learning new information.   °· You need more time to complete tasks or assignments than before.   °· You have increased irritability or decreased ability to cope with stress. °· You have more symptoms than before. °Seek medical care if you have any of the following symptoms for more than 2 weeks after your injury:  °· Lasting (chronic) headaches.   °· Dizziness or balance problems.   °· Nausea. °· Vision problems.   °· Increased sensitivity to noise or light.   °· Depression or mood swings.   °· Anxiety or irritability.   °· Memory problems.   °· Difficulty concentrating or paying attention.   °· Sleep problems.   °· Feeling tired all the time. °SEEK IMMEDIATE MEDICAL CARE IF:  °· You have severe or worsening headaches. These may be a sign of a blood clot in the brain. °· You have weakness (even if only in one hand, leg, or part of the face). °· You have numbness. °· You have decreased coordination.   °· You vomit repeatedly.  °· You have increased sleepiness. °· One pupil is larger than the other.   °· You have convulsions.   °· You have slurred speech.   °· You have increased confusion. This may be a sign of a blood clot in the brain. °· You have increased restlessness, agitation, or irritability.   °· You are unable to recognize people or places.   °· You have neck pain.   °· It is difficult to wake you up.   °· You have unusual behavior changes.   °· You lose consciousness. °MAKE SURE YOU:  °· Understand these instructions. °· Will watch your condition. °· Will get help right away if you are not doing well or get worse. °Document Released: 03/27/2003 Document Revised: 09/06/2012 Document Reviewed: 07/27/2012 °[employer]® Patient Information ©2014 [employer]. ° °Contusion °A contusion is a deep bruise. Contusions are the result of an injury that caused bleeding under the skin. The  contusion may turn blue, purple, or yellow. Minor injuries will give you a painless contusion, but more severe contusions may stay painful and swollen for a few weeks.  °CAUSES  °A contusion is usually caused by a blow, trauma, or direct force to an area of the body. °SYMPTOMS  °· Swelling and redness of the injured area. °· Bruising of the injured area. °· Tenderness and soreness of the injured area. °· Pain. °DIAGNOSIS  °The diagnosis can be made by taking a history and physical exam. An X-ray, CT scan, or MRI may be needed to determine if there were any associated injuries, such as fractures. °TREATMENT  °Specific   treatment will depend on what area of the body was injured. In general, the best treatment for a contusion is resting, icing, elevating, and applying cold compresses to the injured area. Over-the-counter medicines may also be recommended for pain control. Ask your caregiver what the best treatment is for your contusion. °HOME CARE INSTRUCTIONS  °· Put ice on the injured area. °· Put ice in a plastic bag. °· Place a towel between your skin and the bag. °· Leave the ice on for 15-20 minutes, 03-04 times a day. °· Only take over-the-counter or prescription medicines for pain, discomfort, or fever as directed by your caregiver. Your caregiver may recommend avoiding anti-inflammatory medicines (aspirin, ibuprofen, and naproxen) for 48 hours because these medicines may increase bruising. °· Rest the injured area. °· If possible, elevate the injured area to reduce swelling. °SEEK IMMEDIATE MEDICAL CARE IF:  °· You have increased bruising or swelling. °· You have pain that is getting worse. °· Your swelling or pain is not relieved with medicines. °MAKE SURE YOU:  °· Understand these instructions. °· Will watch your condition. °· Will get help right away if you are not doing well or get worse. °Document Released: 10/14/2004 Document Revised: 03/29/2011 Document Reviewed: 11/09/2010 °[employer]® Patient Information  ©2014 [employer]. ° °

## 2013-04-09 ENCOUNTER — Emergency Department (INDEPENDENT_AMBULATORY_CARE_PROVIDER_SITE_OTHER)
Admission: EM | Admit: 2013-04-09 | Discharge: 2013-04-09 | Disposition: A | Discharge status: Home / Self Care | Payer: Self-pay | Source: Home / Self Care | Attending: Family Medicine | Admitting: Family Medicine

## 2013-04-09 ENCOUNTER — Encounter (HOSPITAL_COMMUNITY): Payer: Self-pay | Admitting: Emergency Medicine

## 2013-04-09 DIAGNOSIS — I10 Essential (primary) hypertension: Secondary | ICD-10-CM

## 2013-04-09 DIAGNOSIS — G8929 Other chronic pain: Secondary | ICD-10-CM

## 2013-04-09 LAB — POCT I-STAT, CHEM 8
BUN: <3 mg/dL — ABNORMAL LOW (ref 6–23)
CALCIUM ION: 1.22 mmol/L (ref 1.12–1.23)
Chloride: 101 mEq/L (ref 96–112)
Creatinine, Ser: 0.8 mg/dL (ref 0.50–1.35)
GLUCOSE: 123 mg/dL — AB (ref 70–99)
HEMATOCRIT: 49 % (ref 39.0–52.0)
HEMOGLOBIN: 16.7 g/dL (ref 13.0–17.0)
POTASSIUM: 3.5 meq/L — AB (ref 3.7–5.3)
Sodium: 142 mEq/L (ref 137–147)
TCO2: 26 mmol/L (ref 0–100)

## 2013-04-09 MED ORDER — LISINOPRIL-HYDROCHLOROTHIAZIDE 20-25 MG PO TABS: 1.0000 | ORAL_TABLET | Freq: Every day | ORAL | Status: DC

## 2013-04-09 MED ORDER — OXYCODONE HCL 10 MG PO TABS: ORAL_TABLET | ORAL | Status: DC

## 2013-04-09 MED ORDER — DICLOFENAC SODIUM 75 MG PO TBEC: 75.0000 mg | DELAYED_RELEASE_TABLET | Freq: Two times a day (BID) | ORAL | Status: DC | PRN

## 2013-04-09 NOTE — Discharge Instructions (Signed)
Thank you for coming in today. Please followup at the [place] [facility].  Please see [name] [name] to qualify for reduced or free medical services within the [facility].  Call her at [phone] today.  Please start reducing oxycodone. I have prescribed oxycodone taper the last 4 days will help reduce withdrawal symptoms.  You will have withdrawal symptoms and you will have pain.  Take diclofenac for pain as needed. Take diclofenac with omeprazole (over the counter medicine) to prevent stomach upset.   Please stop lisinopril and start lisinopril/hydrochlorothiazide.   Followup with your new Dr. as soon as possible Come back or go to the emergency room if you notice new weakness new numbness problems walking or bowel or bladder problems.   Arterial Hypertension Arterial hypertension (high blood pressure) is a condition of elevated pressure in your blood vessels. Hypertension over a long period of time is a risk factor for strokes, heart attacks, and heart failure. It is also the leading cause of kidney (renal) failure.  CAUSES   In Adults -- Over 90% of all hypertension has no known cause. This is called essential or primary hypertension. In the other 10% of people with hypertension, the increase in blood pressure is caused by another disorder. This is called secondary hypertension. Important causes of secondary hypertension are:  Heavy alcohol use.  Obstructive sleep apnea.  Hyperaldosterosim (Conn's syndrome).  Steroid use.  Chronic kidney failure.  Hyperparathyroidism.  Medications.  Renal artery stenosis.  Pheochromocytoma.  Cushing's disease.  Coarctation of the aorta.  Scleroderma renal crisis.  Licorice (in excessive amounts).  Drugs (cocaine, methamphetamine). Your caregiver can explain any items above that apply to you.  In Children -- Secondary hypertension is more common and should always be considered.  Pregnancy -- Few women of  childbearing age have high blood pressure. However, up to 10% of them develop hypertension of pregnancy. Generally, this will not harm the woman. It may be a sign of 3 complications of pregnancy: preeclampsia, HELLP syndrome, and eclampsia. Follow up and control with medication is necessary. SYMPTOMS   This condition normally does not produce any noticeable symptoms. It is usually found during a routine exam.  Malignant hypertension is a late problem of high blood pressure. It may have the following symptoms:  Headaches.  Blurred vision.  End-organ damage (this means your kidneys, heart, lungs, and other organs are being damaged).  Stressful situations can increase the blood pressure. If a person with normal blood pressure has their blood pressure go up while being seen by their caregiver, this is often termed "white coat hypertension." Its importance is not known. It may be related with eventually developing hypertension or complications of hypertension.  Hypertension is often confused with mental tension, stress, and anxiety. DIAGNOSIS  The diagnosis is made by 3 separate blood pressure measurements. They are taken at least 1 week apart from each other. If there is organ damage from hypertension, the diagnosis may be made without repeat measurements. Hypertension is usually identified by having blood pressure readings:  Above 140/90 mmHg measured in both arms, at 3 separate times, over a couple weeks.  Over 130/80 mmHg should be considered a risk factor and may require treatment in patients with diabetes. Blood pressure readings over 120/80 mmHg are called "pre-hypertension" even in non-diabetic patients. To get a true blood pressure measurement, use the following guidelines. Be aware of the factors that can alter blood pressure readings.  Take measurements at least 1 hour after caffeine.  Take measurements 30 minutes after smoking and without any stress. This is another reason to quit  smoking  it raises your blood pressure.  Use a proper cuff size. Ask your caregiver if you are not sure about your cuff size.  Most home blood pressure cuffs are automatic. They will measure systolic and diastolic pressures. The systolic pressure is the pressure reading at the start of sounds. Diastolic pressure is the pressure at which the sounds disappear. If you are elderly, measure pressures in multiple postures. Try sitting, lying or standing.  Sit at rest for a minimum of 5 minutes before taking measurements.  You should not be on any medications like decongestants. These are found in many cold medications.  Record your blood pressure readings and review them with your caregiver. If you have hypertension:  Your caregiver may do tests to be sure you do not have secondary hypertension (see "causes" above).  Your caregiver may also look for signs of metabolic syndrome. This is also called Syndrome X or Insulin Resistance Syndrome. You may have this syndrome if you have type 2 diabetes, abdominal obesity, and abnormal blood lipids in addition to hypertension.  Your caregiver will take your medical and family history and perform a physical exam.  Diagnostic tests may include blood tests (for glucose, cholesterol, potassium, and kidney function), a urinalysis, or an EKG. Other tests may also be necessary depending on your condition. PREVENTION  There are important lifestyle issues that you can adopt to reduce your chance of developing hypertension:  Maintain a normal weight.  Limit the amount of salt (sodium) in your diet.  Exercise often.  Limit alcohol intake.  Get enough potassium in your diet. Discuss specific advice with your caregiver.  Follow a DASH diet (dietary approaches to stop hypertension). This diet is rich in fruits, vegetables, and low-fat dairy products, and avoids certain fats. PROGNOSIS  Essential hypertension cannot be cured. Lifestyle changes and medical  treatment can lower blood pressure and reduce complications. The prognosis of secondary hypertension depends on the underlying cause. Many people whose hypertension is controlled with medicine or lifestyle changes can live a normal, healthy life.  RISKS AND COMPLICATIONS  While high blood pressure alone is not an illness, it often requires treatment due to its short- and long-term effects on many organs. Hypertension increases your risk for:  CVAs or strokes (cerebrovascular accident).  Heart failure due to chronically high blood pressure (hypertensive cardiomyopathy).  Heart attack (myocardial infarction).  Damage to the retina (hypertensive retinopathy).  Kidney failure (hypertensive nephropathy). Your caregiver can explain list items above that apply to you. Treatment of hypertension can significantly reduce the risk of complications. TREATMENT   For overweight patients, weight loss and regular exercise are recommended. Physical fitness lowers blood pressure.  Mild hypertension is usually treated with diet and exercise. A diet rich in fruits and vegetables, fat-free dairy products, and foods low in fat and salt (sodium) can help lower blood pressure. Decreasing salt intake decreases blood pressure in a 1/3 of people.  Stop smoking if you are a smoker. The steps above are highly effective in reducing blood pressure. While these actions are easy to suggest, they are difficult to achieve. Most patients with moderate or severe hypertension end up requiring medications to bring their blood pressure down to a normal level. There are several classes of medications for treatment. Blood pressure pills (antihypertensives) will lower blood pressure by their different actions. Lowering the blood pressure by 10 mmHg may decrease the risk  of complications by as much as 25%. The goal of treatment is effective blood pressure control. This will reduce your risk for complications. Your caregiver will help you  determine the best treatment for you according to your lifestyle. What is excellent treatment for one person, may not be for you. HOME CARE INSTRUCTIONS   Do not smoke.  Follow the lifestyle changes outlined in the "Prevention" section.  If you are on medications, follow the directions carefully. Blood pressure medications must be taken as prescribed. Skipping doses reduces their benefit. It also puts you at risk for problems.  Follow up with your caregiver, as directed.  If you are asked to monitor your blood pressure at home, follow the guidelines in the "Diagnosis" section above. SEEK MEDICAL CARE IF:   You think you are having medication side effects.  You have recurrent headaches or lightheadedness.  You have swelling in your ankles.  You have trouble with your vision. SEEK IMMEDIATE MEDICAL CARE IF:   You have sudden onset of chest pain or pressure, difficulty breathing, or other symptoms of a heart attack.  You have a severe headache.  You have symptoms of a stroke (such as sudden weakness, difficulty speaking, difficulty walking). MAKE SURE YOU:   Understand these instructions.  Will watch your condition.  Will get help right away if you are not doing well or get worse. Document Released: 01/04/2005 Document Revised: 03/29/2011 Document Reviewed: 08/04/2006 [facility] Patient Information 2014 [place], [place].   Chronic Pain Chronic pain can be defined as pain that is off and on and lasts for 3 6 months or longer. Many things cause chronic pain, which can make it difficult to make a diagnosis. There are many treatment options available for chronic pain. However, finding a treatment that works well for you may require trying various approaches until the right one is found. Many people benefit from a combination of two or more types of treatment to control their pain. SYMPTOMS  Chronic pain can occur anywhere in the body and can range from mild to very severe. Some types  of chronic pain include:  Headache.  Low back pain.  Cancer pain.  Arthritis pain.  Neurogenic pain. This is pain resulting from damage to nerves. People with chronic pain may also have other symptoms such as:  Depression.  Anger.  Insomnia.  Anxiety. DIAGNOSIS  Your health care provider will help diagnose your condition over time. In many cases, the initial focus will be on excluding possible conditions that could be causing the pain. Depending on your symptoms, your health care provider may order tests to diagnose your condition. Some of these tests may include:   Blood tests.   CT scan.   MRI.   X-rays.   Ultrasounds.   Nerve conduction studies.  You may need to see a specialist.  TREATMENT  Finding treatment that works well may take time. You may be referred to a pain specialist. He or she may prescribe medicine or therapies, such as:   Mindful meditation or yoga.  Shots (injections) of numbing or pain-relieving medicines into the spine or area of pain.  Local electrical stimulation.  Acupuncture.   Massage therapy.   Aroma, color, light, or sound therapy.   Biofeedback.   Working with a physical therapist to keep from getting stiff.   Regular, gentle exercise.   Cognitive or behavioral therapy.   Group support.  Sometimes, surgery may be recommended.  HOME CARE INSTRUCTIONS   Take all medicines as directed  by your health care provider.   Lessen stress in your life by relaxing and doing things such as listening to calming music.   Exercise or be active as directed by your health care provider.   Eat a healthy diet and include things such as vegetables, fruits, fish, and lean meats in your diet.   Keep all follow-up appointments with your health care provider.   Attend a support group with others suffering from chronic pain. SEEK MEDICAL CARE IF:   Your pain gets worse.   You develop a new pain that was not there  before.   You cannot tolerate medicines given to you by your health care provider.   You have new symptoms since your last visit with your health care provider.  SEEK IMMEDIATE MEDICAL CARE IF:   You feel weak.   You have decreased sensation or numbness.   You lose control of bowel or bladder function.   Your pain suddenly gets much worse.   You develop shaking.  You develop chills.  You develop confusion.  You develop chest pain.  You develop shortness of breath.  MAKE SURE YOU:  Understand these instructions.  Will watch your condition.  Will get help right away if you are not doing well or get worse. Document Released: 09/26/2001 Document Revised: 09/06/2012 Document Reviewed: 06/30/2012 [facility] Patient Information 2014 [place], [place].
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
## 2013-04-09 NOTE — ED Notes (Signed)
States that due to financial problems, unable to see his MD or get refills for his medications

## 2013-04-14 ENCOUNTER — Emergency Department (HOSPITAL_COMMUNITY)
Admission: EM | Admit: 2013-04-14 | Discharge: 2013-04-14 | Disposition: A | Discharge status: Home / Self Care | Payer: Medicaid Other | Attending: Emergency Medicine | Admitting: Emergency Medicine

## 2013-04-14 ENCOUNTER — Encounter (HOSPITAL_COMMUNITY): Payer: Self-pay | Admitting: Emergency Medicine

## 2013-04-14 DIAGNOSIS — M545 Low back pain, unspecified: Secondary | ICD-10-CM | POA: Insufficient documentation

## 2013-04-14 DIAGNOSIS — M549 Dorsalgia, unspecified: Secondary | ICD-10-CM

## 2013-04-14 DIAGNOSIS — I10 Essential (primary) hypertension: Secondary | ICD-10-CM | POA: Insufficient documentation

## 2013-04-14 DIAGNOSIS — G8929 Other chronic pain: Secondary | ICD-10-CM | POA: Insufficient documentation

## 2013-04-14 DIAGNOSIS — Z7982 Long term (current) use of aspirin: Secondary | ICD-10-CM | POA: Insufficient documentation

## 2013-04-14 DIAGNOSIS — Z79899 Other long term (current) drug therapy: Secondary | ICD-10-CM | POA: Insufficient documentation

## 2013-04-14 MED ORDER — OXYCODONE HCL 20 MG PO TABS: 1.0000 | ORAL_TABLET | Freq: Four times a day (QID) | ORAL | Status: DC

## 2013-04-14 MED ORDER — OXYCODONE HCL 5 MG PO TABS
20.0000 mg | ORAL_TABLET | Freq: Once | ORAL | Status: AC
  Administered 2013-04-14: 20 mg via ORAL
  Filled 2013-04-14: qty 4

## 2013-04-14 NOTE — ED Notes (Signed)
Pt states he has degenerative disc disease in L4/L5. Pt states pain in lower back pain has gotten worse over past few days, pt denies any other new symptoms besides this. Pt ambulatory.

## 2013-04-14 NOTE — ED Provider Notes (Signed)
CSN: [id number]     Arrival date & time 04/14/13  8657 History   First MD Initiated Contact with Patient 04/14/13 [phone]     Chief Complaint  Patient presents with  . Back Pain     (Consider location/radiation/quality/duration/timing/severity/associated sxs/prior Treatment) HPI  Patient with chronic back pain x many years, previously controlled well at pain clinic, recently lost his insurance and has spent all of his money, moved back in with his parents.  States he has chronic gradually worsening pain pain, described as sharp, worse with movement, occasional pins and needles in his thighs.  Seen by Dr Magnus Ivan (ortho) in the past and will eventually have lumbar surgery.  He has been set up to get the orange card and has an appointment with [facility] on 05/07/13.  Until then, he is unable to afford going to the pain clinic. Denies fevers, chills, abdominal pain, loss of control of bowel or bladder, bowel, urinary, complaints, weakness or numbness of the extremities, saddle anesthesia.   Was seen at [facility] for the same 2 days ago.      Past Medical History  Diagnosis Date  . Hypertension   . Back pain, chronic    Past Surgical History  Procedure Laterality Date  . Knee arthroscopy    . Hand surgery    . Tonsillectomy    . Cotton osteotomy with graft Left 05/25/2012    @ [facility]   Family History  Problem Relation Age of Onset  . Cancer Mother    History  Substance Use Topics  . Smoking status: Never Smoker   . Smokeless tobacco: Never Used  . Alcohol Use: 3.0 oz/week    5 Cans of beer per week     Comment: occasionally    Review of Systems  Constitutional: Negative for fever.  Respiratory: Negative for cough and shortness of breath.   Cardiovascular: Negative for chest pain.  Gastrointestinal: Negative for nausea, vomiting, abdominal pain and diarrhea.  Genitourinary: Negative for dysuria, urgency and frequency.  Musculoskeletal: Positive for back  pain.  Neurological: Negative for weakness and numbness.  All other systems reviewed and are negative.      Allergies  Ibuprofen; Nsaids; and Ultram  Home Medications   Current Outpatient Rx  Name  Route  Sig  Dispense  Refill  . aspirin EC 81 MG tablet   Oral   Take 81 mg by mouth every morning.          Marland Kitchen lisinopril (PRINIVIL,ZESTRIL) 20 MG tablet   Oral   Take 20 mg by mouth daily.         Marland Kitchen lisinopril-hydrochlorothiazide (PRINZIDE,ZESTORETIC) 20-25 MG per tablet   Oral   Take 1 tablet by mouth daily.   30 tablet   1   . Multiple Vitamins-Minerals (MULTIVITAMIN WITH MINERALS) tablet   Oral   Take 1 tablet by mouth every morning.          . Oxycodone HCl 20 MG TABS   Oral   Take 1 tablet (20 mg total) by mouth 5 (five) times daily.   10 tablet   0    BP 168/103  Pulse 82  Temp(Src) 97.5 F (36.4 C) (Oral)  Resp 20  SpO2 95% Physical Exam  Nursing note and vitals reviewed. Constitutional: He appears well-developed and well-nourished. No distress.  HENT:  Head: Normocephalic and atraumatic.  Neck: Neck supple.  Pulmonary/Chest: Effort normal.  Abdominal: Soft. He exhibits no distension and no mass.  There is no tenderness. There is no rebound and no guarding.  Musculoskeletal:  Diffuse tenderness to lumbar spine without focal tenderness or skin changes.  Lower extremities:  Strength 5/5, sensation intact, distal pulses intact.     Neurological: He is alert.  Skin: He is not diaphoretic.    ED Course  Procedures (including critical care time) Labs Review Labs Reviewed - No data to display Imaging Review No results found.   EKG Interpretation None      DEA database shows consistent refills of Oxycodone 20mg  tabs, twice monthly.  Last 03/28/13.  Almost entirely by single provider.    MDM   Final diagnoses:  Chronic back pain   Afebrile, nontoxic patient with chronic low back pain with no acute changes. No red flags. Pt is having  financial difficulties currently and has an appointment with new PCP next month, currently without financial ability to continue in pain management.  Pt is on high doses of oxycodone and appears consistent with his use and refills according to [facility].  Concern that patient will both go into withdrawal and be unable to function with his chronic pain with abruptly stopping medications.  D/C home with short course of his normal dose of pain medication.  PCP follow up.  Discussed result, findings, treatment, and follow up  with patient.  Pt given return precautions.  Pt verbalizes understanding and agrees with plan.      I doubt any other EMC precluding discharge at this time including, but not necessarily limited to the following: cauda equina, acute cord compression or significant nerve root compression, ruptured disk, AAA, fracture, tumor, or infection.      SidneyEmily Tag Wurtz, PA-C 04/14/13 [phone]

## 2013-04-14 NOTE — ED Provider Notes (Signed)
Medical screening examination/treatment/procedure(s) were performed by non-physician practitioner and as supervising physician I was immediately available for consultation/collaboration.   EKG Interpretation None        [name], MD 04/14/13 1522 

## 2013-04-14 NOTE — Discharge Instructions (Signed)
Read the information below.  Use the prescribed medication as directed.  Please discuss all new medications with your pharmacist.  You may return to the Emergency Department at any time for worsening condition or any new symptoms that concern you.   If you develop fevers, loss of control of bowel or bladder, weakness or numbness in your legs, or are unable to walk, return to the ER for a recheck.    Chronic Back Pain  When back pain lasts longer than 3 months, it is called chronic back pain.People with chronic back pain often go through certain periods that are more intense (flare-ups).  CAUSES Chronic back pain can be caused by wear and tear (degeneration) on different structures in your back. These structures include:  The bones of your spine (vertebrae) and the joints surrounding your spinal cord and nerve roots (facets).  The strong, fibrous tissues that connect your vertebrae (ligaments). Degeneration of these structures may result in pressure on your nerves. This can lead to constant pain. HOME CARE INSTRUCTIONS  Avoid bending, heavy lifting, prolonged sitting, and activities which make the problem worse.  Take brief periods of rest throughout the day to reduce your pain. Lying down or standing usually is better than sitting while you are resting.  Take over-the-counter or prescription medicines only as directed by your caregiver. SEEK IMMEDIATE MEDICAL CARE IF:   You have weakness or numbness in one of your legs or feet.  You have trouble controlling your bladder or bowels.  You have nausea, vomiting, abdominal pain, shortness of breath, or fainting. Document Released: 02/12/2004 Document Revised: 03/29/2011 Document Reviewed: 12/19/2010 [facility] Patient Information 2014 [place], [place].

## 2013-04-22 ENCOUNTER — Encounter (HOSPITAL_COMMUNITY): Payer: Self-pay | Admitting: Emergency Medicine

## 2013-04-22 ENCOUNTER — Emergency Department (HOSPITAL_COMMUNITY)
Admission: EM | Admit: 2013-04-22 | Discharge: 2013-04-22 | Disposition: A | Discharge status: Home / Self Care | Payer: Medicaid Other | Attending: Emergency Medicine | Admitting: Emergency Medicine

## 2013-04-22 DIAGNOSIS — G8929 Other chronic pain: Secondary | ICD-10-CM | POA: Insufficient documentation

## 2013-04-22 DIAGNOSIS — Z7982 Long term (current) use of aspirin: Secondary | ICD-10-CM | POA: Insufficient documentation

## 2013-04-22 DIAGNOSIS — Z79899 Other long term (current) drug therapy: Secondary | ICD-10-CM | POA: Insufficient documentation

## 2013-04-22 DIAGNOSIS — I10 Essential (primary) hypertension: Secondary | ICD-10-CM | POA: Insufficient documentation

## 2013-04-22 DIAGNOSIS — M549 Dorsalgia, unspecified: Secondary | ICD-10-CM

## 2013-04-22 MED ORDER — HYDROMORPHONE HCL PF 2 MG/ML IJ SOLN
2.0000 mg | Freq: Once | INTRAMUSCULAR | Status: AC
  Administered 2013-04-22: 2 mg via INTRAMUSCULAR
  Filled 2013-04-22: qty 1

## 2013-04-22 MED ORDER — OXYCODONE HCL 20 MG PO TABS: 20.0000 mg | ORAL_TABLET | ORAL | Status: DC | PRN

## 2013-04-22 MED ORDER — OXYCODONE HCL 20 MG PO TABS: 5.0000 mg | ORAL_TABLET | ORAL | Status: DC | PRN
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
## 2013-04-22 NOTE — Discharge Instructions (Signed)
Return to the ED with any concerns including weakness of legs, loss of control of bowel or bladder, fever/chills, or any other alarming symptoms
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
## 2013-05-02 ENCOUNTER — Encounter (HOSPITAL_COMMUNITY): Payer: Self-pay | Admitting: Emergency Medicine

## 2013-05-02 ENCOUNTER — Emergency Department (HOSPITAL_COMMUNITY)
Admission: EM | Admit: 2013-05-02 | Discharge: 2013-05-02 | Disposition: A | Discharge status: Home / Self Care | Payer: Medicaid Other | Attending: Emergency Medicine | Admitting: Emergency Medicine

## 2013-05-02 DIAGNOSIS — Z7982 Long term (current) use of aspirin: Secondary | ICD-10-CM | POA: Insufficient documentation

## 2013-05-02 DIAGNOSIS — R269 Unspecified abnormalities of gait and mobility: Secondary | ICD-10-CM | POA: Insufficient documentation

## 2013-05-02 DIAGNOSIS — M545 Low back pain, unspecified: Secondary | ICD-10-CM | POA: Insufficient documentation

## 2013-05-02 DIAGNOSIS — Z79899 Other long term (current) drug therapy: Secondary | ICD-10-CM | POA: Insufficient documentation

## 2013-05-02 DIAGNOSIS — I10 Essential (primary) hypertension: Secondary | ICD-10-CM | POA: Insufficient documentation

## 2013-05-02 DIAGNOSIS — M549 Dorsalgia, unspecified: Secondary | ICD-10-CM

## 2013-05-02 DIAGNOSIS — G8929 Other chronic pain: Secondary | ICD-10-CM | POA: Insufficient documentation

## 2013-05-02 MED ORDER — OXYCODONE-ACETAMINOPHEN 5-325 MG PO TABS: 2.0000 | ORAL_TABLET | Freq: Once | ORAL | Status: DC

## 2013-05-02 MED ORDER — OXYCODONE HCL 5 MG PO TABS
20.0000 mg | ORAL_TABLET | Freq: Once | ORAL | Status: AC
  Administered 2013-05-02: 20 mg via ORAL
  Filled 2013-05-02: qty 4

## 2013-05-02 MED ORDER — OXYCODONE HCL 20 MG PO TABS: 20.0000 mg | ORAL_TABLET | ORAL | Status: DC | PRN

## 2013-05-02 NOTE — ED Provider Notes (Signed)
CSN: [id number]     Arrival date & time 05/02/13  0606 History   First MD Initiated Contact with Patient 05/02/13 [phone]     Chief Complaint  Patient presents with  . Back Pain     (Consider location/radiation/quality/duration/timing/severity/associated sxs/prior Treatment) HPI Comments: Patient is a 43 year old male with history of hypertension and chronic back pain who presents today with back pain. He reports that this has been going on for years. He lost his insurance and was unable to continue going to a pain clinic. He initially received the [employer] card which did not allow him to go to pain management. He now has a charity card. [facility]. He was seen on 4/5 and was given a 5 day supply of narcotic pain medication. He was able to stretch this medication out and has 1/2 a tab left. His back pain has been worsening. It is a constant pain in his lower back and a intermittent pain in his mid back. His pain radiates down his left leg intermittently. He is able to walk, but states it is painful. He denies bowel or bladder incontinence, drug abuse, hx of CA, fevers, chills, nausea, vomiting, abdominal pain, chest pain, or shortness of breath.   The history is provided by the patient. No language interpreter was used.    Past Medical History  Diagnosis Date  . Hypertension   . Back pain, chronic    Past Surgical History  Procedure Laterality Date  . Knee arthroscopy    . Hand surgery    . Tonsillectomy    . Cotton osteotomy with graft Left 05/25/2012    @ [facility]   Family History  Problem Relation Age of Onset  . Cancer Mother    History  Substance Use Topics  . Smoking status: Never Smoker   . Smokeless tobacco: Never Used  . Alcohol Use: 3.0 oz/week    5 Cans of beer per week     Comment: occasionally    Review of Systems  Constitutional: Negative for fever and chills.  Respiratory: Negative for shortness of breath.   Cardiovascular:  Negative for chest pain.  Musculoskeletal: Positive for arthralgias, back pain, gait problem and myalgias.  All other systems reviewed and are negative.     Allergies  Ibuprofen; Nsaids; and Ultram  Home Medications   Prior to Admission medications   Medication Sig Start Date End Date Taking? Authorizing Provider  aspirin EC 81 MG tablet Take 81 mg by mouth every morning.     Historical Provider, MD  lisinopril-hydrochlorothiazide (PRINZIDE,ZESTORETIC) 20-25 MG per tablet Take 1 tablet by mouth every morning.    Historical Provider, MD  Oxycodone HCl 20 MG TABS Take 1 tablet (20 mg total) by mouth 4 (four) times daily. 04/14/13   Trixie DredgeEmily West, PA-C  Oxycodone HCl 20 MG TABS Take 1 tablet (20 mg total) by mouth every 4 (four) hours as needed. 04/22/13   Ethelda ChickMartha K Linker, MD   BP 125/93  Pulse 91  Temp(Src) 98 F (36.7 C) (Oral)  Resp 20  Ht 6' (1.829 m)  Wt 250 lb (113.399 kg)  BMI 33.90 kg/m2  SpO2 98% Physical Exam  Nursing note and vitals reviewed. Constitutional: He is oriented to person, place, and time. He appears well-developed and well-nourished. He does not appear ill. No distress.  HENT:  Head: Normocephalic and atraumatic.  Right Ear: External ear normal.  Left Ear: External ear normal.  Nose: Nose normal.  Eyes: Conjunctivae are normal.  Neck: Normal range of motion. No spinous process tenderness and no muscular tenderness present. No rigidity. No tracheal deviation present.  Cardiovascular: Normal rate, regular rhythm, normal heart sounds, intact distal pulses and normal pulses.   Pulses:      Radial pulses are 2+ on the right side, and 2+ on the left side.       Posterior tibial pulses are 2+ on the right side, and 2+ on the left side.  Pulmonary/Chest: Effort normal and breath sounds normal. No stridor.  Abdominal: Soft. He exhibits no distension. There is no tenderness.  Musculoskeletal: Normal range of motion.       Back:  SLR positive bilaterally  Strength  5/5 in all extremities.   Neurological: He is alert and oriented to person, place, and time. He has normal strength. No sensory deficit. GCS eye subscore is 4. GCS verbal subscore is 5. GCS motor subscore is 6.  Skin: Skin is warm and dry. He is not diaphoretic.  Psychiatric: He has a normal mood and affect. His behavior is normal.    ED Course  Procedures (including critical care time) Labs Review Labs Reviewed - No data to display  Imaging Review No results found.   EKG Interpretation None      MDM   Final diagnoses:  Chronic back pain    Patient with back pain.  No neurological deficits and normal neuro exam.  Patient can walk but states is painful.  No loss of bowel or bladder control.  No concern for cauda equina.  No fever, night sweats, weight loss, h/o cancer, IVDU.  Patient has appointment with PCP on 4/20 and needs pain medication to get him through to that time. I reviewed the [place] controlled substance reporting system which is consistent with the story the patient tells. I will give him a small rx for narcotic pain medication.       Mora BellmanHannah S Asser Lucena, PA-C 05/02/13 [phone]

## 2013-05-02 NOTE — ED Notes (Signed)
Patient is alert and oriented x3.  He is complaining of chronic back pain that has gotten worse over  The last week.  Patient states that he has been to a pain clinic but lost his insurance and can't afford His medication.  [facility] and [facility] if  He needs medication.  Currently he rates his pain 10 of 10 and is having difficulty walking.

## 2013-05-02 NOTE — ED Provider Notes (Signed)
Medical screening examination/treatment/procedure(s) were performed by non-physician practitioner and as supervising physician I was immediately available for consultation/collaboration.   EKG Interpretation None        Kristen N Ward, DO 05/02/13 2259 

## 2013-05-02 NOTE — Discharge Instructions (Signed)
Back Pain, Adult Low back pain is very common. About 1 in 5 people have back pain.The cause of low back pain is rarely dangerous. The pain often gets better over time.About half of people with a sudden onset of back pain feel better in just 2 weeks. About 8 in 10 people feel better by 6 weeks.  CAUSES Some common causes of back pain include:  Strain of the muscles or ligaments supporting the spine.  Wear and tear (degeneration) of the spinal discs.  Arthritis.  Direct injury to the back. DIAGNOSIS Most of the time, the direct cause of low back pain is not known.However, back pain can be treated effectively even when the exact cause of the pain is unknown.Answering your caregiver's questions about your overall health and symptoms is one of the most accurate ways to make sure the cause of your pain is not dangerous. If your caregiver needs more information, he or she may order lab work or imaging tests (X-rays or MRIs).However, even if imaging tests show changes in your back, this usually does not require surgery. HOME CARE INSTRUCTIONS For many people, back pain returns.Since low back pain is rarely dangerous, it is often a condition that people can learn to manageon their own.   Remain active. It is stressful on the back to sit or stand in one place. Do not sit, drive, or stand in one place for more than 30 minutes at a time. Take short walks on level surfaces as soon as pain allows.Try to increase the length of time you walk each day.  Do not stay in bed.Resting more than 1 or 2 days can delay your recovery.  Do not avoid exercise or work.Your body is made to move.It is not dangerous to be active, even though your back may hurt.Your back will likely heal faster if you return to being active before your pain is gone.  Pay attention to your body when you bend and lift. Many people have less discomfortwhen lifting if they bend their knees, keep the load close to their bodies,and  avoid twisting. Often, the most comfortable positions are those that put less stress on your recovering back.  Find a comfortable position to sleep. Use a firm mattress and lie on your side with your knees slightly bent. If you lie on your back, put a pillow under your knees.  Only take over-the-counter or prescription medicines as directed by your caregiver. Over-the-counter medicines to reduce pain and inflammation are often the most helpful.Your caregiver may prescribe muscle relaxant drugs.These medicines help dull your pain so you can more quickly return to your normal activities and healthy exercise.  Put ice on the injured area.  Put ice in a plastic bag.  Place a towel between your skin and the bag.  Leave the ice on for 15-20 minutes, 03-04 times a day for the first 2 to 3 days. After that, ice and heat may be alternated to reduce pain and spasms.  Ask your caregiver about trying back exercises and gentle massage. This may be of some benefit.  Avoid feeling anxious or stressed.Stress increases muscle tension and can worsen back pain.It is important to recognize when you are anxious or stressed and learn ways to manage it.Exercise is a great option. SEEK MEDICAL CARE IF:  You have pain that is not relieved with rest or medicine.  You have pain that does not improve in 1 week.  You have new symptoms.  You are generally not feeling well. SEEK   IMMEDIATE MEDICAL CARE IF:   You have pain that radiates from your back into your legs.  You develop new bowel or bladder control problems.  You have unusual weakness or numbness in your arms or legs.  You develop nausea or vomiting.  You develop abdominal pain.  You feel faint. Document Released: 01/04/2005 Document Revised: 07/06/2011 Document Reviewed: 05/25/2010 Exit[employer] Patient Information 2014 [employer].  

## 2013-05-07 ENCOUNTER — Ambulatory Visit: Payer: Medicaid Other | Attending: Internal Medicine | Admitting: Internal Medicine

## 2013-05-07 ENCOUNTER — Encounter: Payer: Self-pay | Admitting: Internal Medicine

## 2013-05-07 VITALS — BP 138/91 | HR 84 | Temp 97.8°F | Resp 16 | Wt 252.2 lb

## 2013-05-07 DIAGNOSIS — G8929 Other chronic pain: Secondary | ICD-10-CM | POA: Insufficient documentation

## 2013-05-07 DIAGNOSIS — I10 Essential (primary) hypertension: Secondary | ICD-10-CM | POA: Insufficient documentation

## 2013-05-07 DIAGNOSIS — Z886 Allergy status to analgesic agent status: Secondary | ICD-10-CM | POA: Insufficient documentation

## 2013-05-07 DIAGNOSIS — Z7982 Long term (current) use of aspirin: Secondary | ICD-10-CM | POA: Insufficient documentation

## 2013-05-07 DIAGNOSIS — F3289 Other specified depressive episodes: Secondary | ICD-10-CM

## 2013-05-07 DIAGNOSIS — F329 Major depressive disorder, single episode, unspecified: Secondary | ICD-10-CM | POA: Insufficient documentation

## 2013-05-07 DIAGNOSIS — M79672 Pain in left foot: Secondary | ICD-10-CM

## 2013-05-07 DIAGNOSIS — F32A Depression, unspecified: Secondary | ICD-10-CM

## 2013-05-07 DIAGNOSIS — M545 Low back pain, unspecified: Secondary | ICD-10-CM

## 2013-05-07 DIAGNOSIS — Z79899 Other long term (current) drug therapy: Secondary | ICD-10-CM | POA: Insufficient documentation

## 2013-05-07 DIAGNOSIS — M79609 Pain in unspecified limb: Secondary | ICD-10-CM

## 2013-05-07 DIAGNOSIS — Z008 Encounter for other general examination: Secondary | ICD-10-CM | POA: Insufficient documentation

## 2013-05-07 DIAGNOSIS — Z139 Encounter for screening, unspecified: Secondary | ICD-10-CM

## 2013-05-07 LAB — CBC WITH DIFFERENTIAL/PLATELET
Basophils Absolute: 0 10*3/uL (ref 0.0–0.1)
Basophils Relative: 0 % (ref 0–1)
EOS ABS: 0.2 10*3/uL (ref 0.0–0.7)
EOS PCT: 2 % (ref 0–5)
HEMATOCRIT: 43.8 % (ref 39.0–52.0)
HEMOGLOBIN: 15.2 g/dL (ref 13.0–17.0)
LYMPHS ABS: 3.2 10*3/uL (ref 0.7–4.0)
LYMPHS PCT: 39 % (ref 12–46)
MCH: 28.6 pg (ref 26.0–34.0)
MCHC: 34.7 g/dL (ref 30.0–36.0)
MCV: 82.3 fL (ref 78.0–100.0)
MONO ABS: 0.9 10*3/uL (ref 0.1–1.0)
MONOS PCT: 11 % (ref 3–12)
Neutro Abs: 4 10*3/uL (ref 1.7–7.7)
Neutrophils Relative %: 48 % (ref 43–77)
PLATELETS: 311 10*3/uL (ref 150–400)
RBC: 5.32 MIL/uL (ref 4.22–5.81)
RDW: 13.7 % (ref 11.5–15.5)
WBC: 8.3 10*3/uL (ref 4.0–10.5)

## 2013-05-07 MED ORDER — ACETAMINOPHEN-CODEINE #3 300-30 MG PO TABS: 1.0000 | ORAL_TABLET | Freq: Four times a day (QID) | ORAL | Status: DC | PRN

## 2013-05-07 MED ORDER — GABAPENTIN 300 MG PO CAPS: 300.0000 mg | ORAL_CAPSULE | Freq: Three times a day (TID) | ORAL | Status: DC

## 2013-05-07 NOTE — Progress Notes (Signed)
Patient here to establish care Takes medication for Hypertension and depression Walks with a cane due to left foot surgery

## 2013-05-07 NOTE — Progress Notes (Signed)
Patient Demographics  [name] [name], is a 43 y.o. male  ZOX:[id number]  [id number]  DOB - 08/08/1970  CC:  Chief Complaint  Patient presents with  . Establish Care       HPI: [name] [name] is a 43 y.o. male here today to establish medical care. Patient has history of chronic low back pain, used to follow with the pain management as well as orthopedics, as per patient he then out of insurance has applied 4 HR requesting referral to see pain management, he's allergic to NSAIDs and tramadol and has been taking oxycodone which he was prescribed from emergency room 5 days ago, patient also history of hypertension and is taking lisinopril/hydrochlorothiazide, history of depression as per patient he has been following up with the psychiatrist who prescribed him Adderall and Prozac but he again lost to followup and a psychiatrist moved to a different place. Patient denies any SI or HI. Patient also history of left foot surgery and a still has pain.  Patient has No headache, No chest pain, No abdominal pain - No Nausea, No new weakness tingling or numbness, No Cough - SOB.  Allergies  Allergen Reactions  . Ibuprofen Other (See Comments)    Makes g.i. Upset and stomach burns  . Nsaids Other (See Comments)    GI upset stomach burns  . Ultram [Tramadol Hcl] Other (See Comments)    Stomach cramps   Past Medical History  Diagnosis Date  . Hypertension   . Back pain, chronic    Current Outpatient Prescriptions on File Prior to Visit  Medication Sig Dispense Refill  . aspirin EC 81 MG tablet Take 81 mg by mouth every morning.       Marland Kitchen. lisinopril-hydrochlorothiazide (PRINZIDE,ZESTORETIC) 20-25 MG per tablet Take 1 tablet by mouth every morning.      . Oxycodone HCl (ROXICODONE) 20 MG TABS Take 1 tablet (20 mg total) by mouth every 4 (four) hours as needed for severe pain.  30 tablet  0  . Oxycodone HCl 20 MG TABS Take 1 tablet (20 mg total) by mouth 4 (four) times daily.  20 tablet   0  . Oxycodone HCl 20 MG TABS Take 1 tablet (20 mg total) by mouth every 4 (four) hours as needed.  30 tablet  0   No current facility-administered medications on file prior to visit.   Family History  Problem Relation Age of Onset  . Cancer Mother   . Hypertension Mother   . Hypertension Father   . Diabetes Father   . Diabetes Maternal Uncle    History   Social History  . Marital Status: Single    Spouse Name: N/A    Number of Children: N/A  . Years of Education: N/A   Occupational History  . Not on file.   Social History Main Topics  . Smoking status: Never Smoker   . Smokeless tobacco: Never Used  . Alcohol Use: 3.0 oz/week    5 Cans of beer per week     Comment: occasionally  . Drug Use: No  . Sexual Activity: Not on file   Other Topics Concern  . Not on file   Social History Narrative  . No narrative on file    Review of Systems: Constitutional: Negative for fever, chills, diaphoresis, activity change, appetite change and fatigue. HENT: Negative for ear pain, nosebleeds, congestion, facial swelling, rhinorrhea, neck pain, neck stiffness and ear discharge.  Eyes: Negative for pain, discharge, redness, itching and visual  disturbance. Respiratory: Negative for cough, choking, chest tightness, shortness of breath, wheezing and stridor.  Cardiovascular: Negative for chest pain, palpitations and leg swelling. Gastrointestinal: Negative for abdominal distention. Genitourinary: Negative for dysuria, urgency, frequency, hematuria, flank pain, decreased urine volume, difficulty urinating and dyspareunia.  Musculoskeletal: Negative for back pain, joint swelling,+ arthralgia and gait problem. Neurological: Negative for dizziness, tremors, seizures, syncope, facial asymmetry, speech difficulty, weakness, light-headedness, numbness and headaches.  Hematological: Negative for adenopathy. Does not bruise/bleed easily. Psychiatric/Behavioral: Negative for hallucinations,  behavioral problems, confusion, dysphoric mood, decreased concentration and agitation.    Objective:   Filed Vitals:   05/07/13 1625  BP: 138/91  Pulse: 84  Temp: 97.8 F (36.6 C)  Resp: 16    Physical Exam: Constitutional: Patient appears well-developed and well-nourished. No distress. HENT: Normocephalic, atraumatic, External right and left ear normal. Oropharynx is clear and moist.  Eyes: Conjunctivae and EOM are normal. PERRLA, no scleral icterus. Neck: Normal ROM. Neck supple. No JVD. No tracheal deviation. No thyromegaly. CVS: RRR, S1/S2 +, no murmurs, no gallops, no carotid bruit.  Pulmonary: Effort and breath sounds normal, no stridor, rhonchi, wheezes, rales.  Abdominal: Soft. BS +, no distension, tenderness, rebound or guarding.  Musculoskeletal: Low lumbar spinal and paraspinal tenderness, with SLR test patient complains of tightness in the back.  Neuro: Alert. Normal reflexes, muscle tone coordination. No cranial nerve deficit. Skin: Skin is warm and dry. No rash noted. Not diaphoretic. No erythema. No pallor. Psychiatric: Normal mood and affect. Behavior, judgment, thought content normal.  Lab Results  Component Value Date   WBC 8.3 03/28/2010   HGB 16.7 04/09/2013   HCT 49.0 04/09/2013   MCV 89.1 03/28/2010   PLT 235 03/28/2010   Lab Results  Component Value Date   CREATININE 0.80 04/09/2013   BUN <3* 04/09/2013   NA 142 04/09/2013   K 3.5* 04/09/2013   CL 101 04/09/2013   CO2 26 03/28/2010    No results found for this basename: HGBA1C   Lipid Panel  No results found for this basename: chol, trig, hdl, cholhdl, vldl, ldlcalc       Assessment and plan:   1. Chronic low back pain  - Ambulatory referral to Pain Clinic - acetaminophen-codeine (TYLENOL #3) 300-30 MG per tablet; Take 1 tablet by mouth every 6 (six) hours as needed for moderate pain.  Dispense: 60 tablet; Refill: 0 - gabapentin (NEURONTIN) 300 MG capsule; Take 1 capsule (300 mg total) by mouth 3  (three) times daily.  Dispense: 90 capsule; Refill: 3  2. Chronic pain in left foot  - Ambulatory referral to Pain Clinic  3. Essential hypertension, benign Blood pressure is well controlled continue with current medication lisinopril/hydrochlorothiazide, we'll do blood chemistry.  4. Depression  - Ambulatory referral to Psychiatry  5. Screening Ordered baseline blood work.  - CBC with Differential - COMPLETE METABOLIC PANEL WITH GFR - TSH - Lipid panel - Vit D  25 hydroxy (rtn osteoporosis monitoring)   Return in about 3 months (around 08/06/2013) for hypertension, back pain.     Doris Cheadleeepak Lorrena Goranson, MD

## 2013-05-08 ENCOUNTER — Telehealth: Payer: Self-pay

## 2013-05-08 LAB — LIPID PANEL
CHOLESTEROL: 197 mg/dL (ref 0–200)
HDL: 36 mg/dL — ABNORMAL LOW (ref >39)
LDL CALC: 96 mg/dL (ref 0–99)
Total CHOL/HDL Ratio: 5.5 Ratio
Triglycerides: 325 mg/dL — ABNORMAL HIGH (ref <150)
VLDL: 65 mg/dL — AB (ref 0–40)

## 2013-05-08 LAB — COMPLETE METABOLIC PANEL WITH GFR
ALT: 21 U/L (ref 0–53)
AST: 21 U/L (ref 0–37)
Albumin: 4.5 g/dL (ref 3.5–5.2)
Alkaline Phosphatase: 59 U/L (ref 39–117)
BILIRUBIN TOTAL: 0.4 mg/dL (ref 0.2–1.2)
BUN: 8 mg/dL (ref 6–23)
CALCIUM: 9.9 mg/dL (ref 8.4–10.5)
CHLORIDE: 100 meq/L (ref 96–112)
CO2: 29 meq/L (ref 19–32)
CREATININE: 0.89 mg/dL (ref 0.50–1.35)
GLUCOSE: 105 mg/dL — AB (ref 70–99)
Potassium: 4.4 mEq/L (ref 3.5–5.3)
Sodium: 138 mEq/L (ref 135–145)
Total Protein: 7.2 g/dL (ref 6.0–8.3)

## 2013-05-08 LAB — VITAMIN D 25 HYDROXY (VIT D DEFICIENCY, FRACTURES): Vit D, 25-Hydroxy: 20 ng/mL — ABNORMAL LOW (ref 30–89)

## 2013-05-08 LAB — TSH: TSH: 3.593 u[IU]/mL (ref 0.350–4.500)

## 2013-05-08 MED ORDER — VITAMIN D (ERGOCALCIFEROL) 1.25 MG (50000 UNIT) PO CAPS: 50000.0000 [IU] | ORAL_CAPSULE | ORAL | Status: DC

## 2013-05-08 NOTE — Telephone Encounter (Signed)
Message copied by Lestine MountJUAREZ, Severus Brodzinski L on Tue May 08, 2013  3:38 PM ------      Message from: Doris CheadleADVANI, DEEPAK      Created: Tue May 08, 2013 12:00 PM       Blood work reviewed, noticed low vitamin D, call patient advise to start ergocalciferol 50,000 units once a week for the duration of  12 weeks.       noticed impaired fasting glucose and high triglycerides, call and advise patient for low carbohydrate low-fat diet.       ------

## 2013-05-08 NOTE — Telephone Encounter (Signed)
Spoke with patient  He is aware of his lab results [facility] [address]

## 2013-05-12 ENCOUNTER — Emergency Department (HOSPITAL_COMMUNITY)
Admission: EM | Admit: 2013-05-12 | Discharge: 2013-05-12 | Disposition: A | Discharge status: Home / Self Care | Payer: Medicaid Other | Attending: Emergency Medicine | Admitting: Emergency Medicine

## 2013-05-12 ENCOUNTER — Encounter (HOSPITAL_COMMUNITY): Payer: Self-pay | Admitting: Emergency Medicine

## 2013-05-12 DIAGNOSIS — I10 Essential (primary) hypertension: Secondary | ICD-10-CM | POA: Insufficient documentation

## 2013-05-12 DIAGNOSIS — G8929 Other chronic pain: Secondary | ICD-10-CM | POA: Insufficient documentation

## 2013-05-12 DIAGNOSIS — M549 Dorsalgia, unspecified: Secondary | ICD-10-CM | POA: Insufficient documentation

## 2013-05-12 DIAGNOSIS — Z79899 Other long term (current) drug therapy: Secondary | ICD-10-CM | POA: Insufficient documentation

## 2013-05-12 MED ORDER — OXYCODONE HCL 5 MG PO TABS
10.0000 mg | ORAL_TABLET | Freq: Once | ORAL | Status: AC
  Administered 2013-05-12: 10 mg via ORAL
  Filled 2013-05-12: qty 2

## 2013-05-12 NOTE — ED Provider Notes (Signed)
CSN: [id number]     Arrival date & time 05/12/13  0730 History   First MD Initiated Contact with Patient 05/12/13 0750     Chief Complaint  Patient presents with  . Back Pain      HPI Patient presents emergency department because of ongoing chronic back pain.  He states he's had this for many years.  He is to be managed in pain clinic but lost his insurance.  He's currently being treated at the [facility] and they will not prescribe narcotics per policy.  He was told to come to the emergency department for his ongoing pain needs until his followup with the new pain clinic referral.  Patient reports no new worsening of his symptoms.    Past Medical History  Diagnosis Date  . Hypertension   . Back pain, chronic    Past Surgical History  Procedure Laterality Date  . Knee arthroscopy    . Hand surgery    . Tonsillectomy    . Cotton osteotomy with graft Left 05/25/2012    @ [facility]   Family History  Problem Relation Age of Onset  . Cancer Mother   . Hypertension Mother   . Hypertension Father   . Diabetes Father   . Diabetes Maternal Uncle    History  Substance Use Topics  . Smoking status: Never Smoker   . Smokeless tobacco: Never Used  . Alcohol Use: 3.0 oz/week    5 Cans of beer per week     Comment: occasionally    Review of Systems  All other systems reviewed and are negative.     Allergies  Ibuprofen; Nsaids; and Ultram  Home Medications   Prior to Admission medications   Medication Sig Start Date End Date Taking? Authorizing Provider  acetaminophen-codeine (TYLENOL #3) 300-30 MG per tablet Take 1 tablet by mouth every 6 (six) hours as needed for moderate pain. 05/07/13   Doris Cheadleeepak Advani, MD  amphetamine-dextroamphetamine (ADDERALL) 30 MG tablet Take 30 mg by mouth daily.    Historical Provider, MD  aspirin EC 81 MG tablet Take 81 mg by mouth every morning.     Historical Provider, MD  FLUoxetine HCl (PROZAC PO) Take by mouth.    Historical  Provider, MD  gabapentin (NEURONTIN) 300 MG capsule Take 1 capsule (300 mg total) by mouth 3 (three) times daily. 05/07/13   Doris Cheadleeepak Advani, MD  lisinopril-hydrochlorothiazide (PRINZIDE,ZESTORETIC) 20-25 MG per tablet Take 1 tablet by mouth every morning.    Historical Provider, MD  Oxycodone HCl (ROXICODONE) 20 MG TABS Take 1 tablet (20 mg total) by mouth every 4 (four) hours as needed for severe pain. 05/02/13   Mora BellmanHannah S Merrell, PA-C  Oxycodone HCl 20 MG TABS Take 1 tablet (20 mg total) by mouth 4 (four) times daily. 04/14/13   Trixie DredgeEmily West, PA-C  Oxycodone HCl 20 MG TABS Take 1 tablet (20 mg total) by mouth every 4 (four) hours as needed. 04/22/13   Ethelda ChickMartha K Linker, MD  Vitamin D, Ergocalciferol, (DRISDOL) 50000 UNITS CAPS capsule Take 1 capsule (50,000 Units total) by mouth every 7 (seven) days. 05/08/13   Deepak Advani, MD   BP 122/110  Pulse 91  Temp(Src) 97.6 F (36.4 C) (Oral)  Resp 16  SpO2 97% Physical Exam  Nursing note and vitals reviewed. Constitutional: He is oriented to person, place, and time. He appears well-developed and well-nourished.  HENT:  Head: Normocephalic.  Eyes: EOM are normal.  Neck: Normal range of motion.  Pulmonary/Chest: Effort normal.  Abdominal: He exhibits no distension.  Musculoskeletal: Normal range of motion.  Neurological: He is alert and oriented to person, place, and time.  Psychiatric: He has a normal mood and affect.    ED Course  Procedures (including critical care time) Labs Review Labs Reviewed - No data to display  Imaging Review No results found.   EKG Interpretation None      MDM   Final diagnoses:  Chronic back pain    Chronic pain exacerbation. MSE complete    Lyanne CoKevin M Nyilah Kight, MD 05/12/13 [phone]

## 2013-05-12 NOTE — ED Notes (Signed)
Per pt, states chronic back pain for years-increased pain, [facility]

## 2013-05-12 NOTE — Discharge Instructions (Signed)

## 2013-05-15 ENCOUNTER — Telehealth: Payer: Self-pay | Admitting: *Deleted

## 2013-05-15 ENCOUNTER — Telehealth: Payer: Self-pay

## 2013-05-15 NOTE — Telephone Encounter (Signed)
LCSW contacted patient after note written from pain clinic.  LCSW spoke with patient who stated that he needed help with managing his pain which included 'shots' and oral medication.  Pain clinic stated that they give shots but patient was not a good fit for their clinic. LCSW will confer with team to identify if there is another option for pain management.  Patient stated that he was also dealing with emotional challenges and had previously been treatment for mental health challenges in the past. LCSW recommended that patient [facility].  Patient stated that he was not suicidal but frequently felt hopeless due to the intensity of his pain. LCSW reiterated the importance of mental health treatment and support.   Beverly Sessionsywan J Keerthana Vanrossum MSW, LCSW

## 2013-05-15 NOTE — Telephone Encounter (Signed)
[facility]  Referral reviewed by Dr Wynn BankerKirsteins, he has nothing to offer patient.  [facility]  Advised her we would not be able to see the patient.  Also informed her that when I spoke to the patient the last thing he said before he hung up the phone was he "had a gun he would take care of it."  I immediatly called the patient back and advised him I would do everything I could to get him care.  Per Arna Mediciora she is going to refer the patient to the social worker they have in the office.  His name is [name] [name].  The social worker will follow up with patient.

## 2013-05-16 ENCOUNTER — Encounter: Payer: Self-pay | Admitting: Family Medicine

## 2013-05-16 ENCOUNTER — Ambulatory Visit: Payer: Medicaid Other | Attending: Family Medicine | Admitting: Family Medicine

## 2013-05-16 VITALS — BP 136/88 | HR 77 | Temp 97.8°F | Ht 72.0 in | Wt 244.0 lb

## 2013-05-16 DIAGNOSIS — F17200 Nicotine dependence, unspecified, uncomplicated: Secondary | ICD-10-CM | POA: Insufficient documentation

## 2013-05-16 DIAGNOSIS — M549 Dorsalgia, unspecified: Secondary | ICD-10-CM | POA: Insufficient documentation

## 2013-05-16 DIAGNOSIS — G8929 Other chronic pain: Secondary | ICD-10-CM

## 2013-05-16 DIAGNOSIS — M545 Low back pain, unspecified: Secondary | ICD-10-CM

## 2013-05-16 MED ORDER — GABAPENTIN 300 MG PO CAPS: 300.0000 mg | ORAL_CAPSULE | Freq: Three times a day (TID) | ORAL | Status: DC

## 2013-05-16 NOTE — Assessment & Plan Note (Signed)
Chronic.   Had been followed at pain center but no longer can afford.   Sounds to have been due to disk disease and DJD.   No acute red flags for impingement or cancer or infection.   Discussed options - will go with increasing dose of gabapentin to 900mg  three times daily.  No narcotics.   He is investigating disability or medicaid Sees a counselor and on treatment for depression

## 2013-05-16 NOTE — Patient Instructions (Addendum)
Thank you for coming in today.  I will increase your Gabapentin to 600mg  three times a day for 1 week.  We will then increase Gabapentin to 900mg  three times a day.  Pain clinic referral has been put in. We will contact you when a appointment can be made.  Contact us if you have any loss of bowels or swelling.  If symptoms become more severe go to ER.
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
## 2013-05-23 ENCOUNTER — Emergency Department (HOSPITAL_COMMUNITY)
Admission: EM | Admit: 2013-05-23 | Discharge: 2013-05-23 | Disposition: A | Discharge status: Home / Self Care | Payer: Medicaid Other | Attending: Emergency Medicine | Admitting: Emergency Medicine

## 2013-05-23 ENCOUNTER — Encounter (HOSPITAL_COMMUNITY): Payer: Self-pay | Admitting: Emergency Medicine

## 2013-05-23 DIAGNOSIS — M545 Low back pain, unspecified: Secondary | ICD-10-CM | POA: Insufficient documentation

## 2013-05-23 DIAGNOSIS — Z79899 Other long term (current) drug therapy: Secondary | ICD-10-CM | POA: Insufficient documentation

## 2013-05-23 DIAGNOSIS — Z7982 Long term (current) use of aspirin: Secondary | ICD-10-CM | POA: Insufficient documentation

## 2013-05-23 DIAGNOSIS — M549 Dorsalgia, unspecified: Secondary | ICD-10-CM

## 2013-05-23 DIAGNOSIS — G8929 Other chronic pain: Secondary | ICD-10-CM

## 2013-05-23 DIAGNOSIS — I10 Essential (primary) hypertension: Secondary | ICD-10-CM | POA: Insufficient documentation

## 2013-05-23 MED ORDER — OXYCODONE-ACETAMINOPHEN 5-325 MG PO TABS
2.0000 | ORAL_TABLET | Freq: Once | ORAL | Status: AC
  Administered 2013-05-23: 2 via ORAL
  Filled 2013-05-23: qty 2

## 2013-05-23 MED ORDER — OXYCODONE HCL 20 MG PO TABS: 20.0000 mg | ORAL_TABLET | ORAL | Status: DC | PRN

## 2013-05-23 NOTE — Discharge Instructions (Signed)
Follow up with your doctor for further evaluation and management. You cannot continue to manage your chronic pain in the Emergency Department.

## 2013-05-23 NOTE — ED Notes (Signed)
Pt states that he has chronic back pain for 16 year. Pt is attempting to get into a pain clinic. Pt states that he has not injured his back recently.

## 2013-05-23 NOTE — ED Notes (Signed)
PA at bedside.

## 2013-05-23 NOTE — ED Provider Notes (Signed)
CSN: [id number]     Arrival date & time 05/23/13  0755 History   First MD Initiated Contact with Patient 05/23/13 0759     Chief Complaint  Patient presents with  . Back Pain     (Consider location/radiation/quality/duration/timing/severity/associated sxs/prior Treatment) HPI Comments: Patient is a 43 year old male with a 16 year history of chronic back pain who presents with gradual onset of lower back pain that started this morning. The pain is aching and severe and does not radiate. The pain is constant. Movement makes the pain worse. Nothing makes the pain better. Patient has seen his PCP who will not prescribe narcotic pain medications. Patient was told to come to the ED for chronic pain management. Patient has been referred to pain management and is waiting to hear back for his appointment. Patient has not tried anything for pain. No associated symptoms. No saddles paresthesias or bladder/bowel incontinence.     Patient is a 43 y.o. male presenting with back pain.  Back Pain Associated symptoms: no abdominal pain, no chest pain, no dysuria, no fever and no weakness     Past Medical History  Diagnosis Date  . Hypertension   . Back pain, chronic    Past Surgical History  Procedure Laterality Date  . Knee arthroscopy    . Hand surgery    . Tonsillectomy    . Cotton osteotomy with graft Left 05/25/2012    @ [facility]   Family History  Problem Relation Age of Onset  . Cancer Mother   . Hypertension Mother   . Hypertension Father   . Diabetes Father   . Diabetes Maternal Uncle    History  Substance Use Topics  . Smoking status: Never Smoker   . Smokeless tobacco: Never Used  . Alcohol Use: No     Comment: occasionally    Review of Systems  Constitutional: Negative for fever, chills and fatigue.  HENT: Negative for trouble swallowing.   Eyes: Negative for visual disturbance.  Respiratory: Negative for shortness of breath.   Cardiovascular: Negative for chest pain and  palpitations.  Gastrointestinal: Negative for nausea, vomiting, abdominal pain and diarrhea.  Genitourinary: Negative for dysuria and difficulty urinating.  Musculoskeletal: Positive for back pain. Negative for arthralgias and neck pain.  Skin: Negative for color change.  Neurological: Negative for dizziness and weakness.  Psychiatric/Behavioral: Negative for dysphoric mood.      Allergies  Ibuprofen; Nsaids; and Ultram  Home Medications   Prior to Admission medications   Medication Sig Start Date End Date Taking? Authorizing Provider  amphetamine-dextroamphetamine (ADDERALL) 30 MG tablet Take 30 mg by mouth daily.   Yes Historical Provider, MD  aspirin EC 81 MG tablet Take 81 mg by mouth every morning.    Yes Historical Provider, MD  lisinopril-hydrochlorothiazide (PRINZIDE,ZESTORETIC) 20-25 MG per tablet Take 1 tablet by mouth every morning.   Yes Historical Provider, MD  Vitamin D, Ergocalciferol, (DRISDOL) 50000 UNITS CAPS capsule Take 1 capsule (50,000 Units total) by mouth every 7 (seven) days. 05/08/13  Yes Doris Cheadleeepak Advani, MD  FLUoxetine HCl (PROZAC PO) Take by mouth.    Historical Provider, MD  Oxycodone HCl 20 MG TABS Take 1 tablet (20 mg total) by mouth every 4 (four) hours as needed. 05/23/13   Brendon Christoffel, PA-C   BP 143/86  Pulse 91  Temp(Src) 97.6 F (36.4 C) (Oral)  Resp 20  Ht 6' (1.829 m)  Wt 245 lb (111.131 kg)  BMI 33.22 kg/m2  SpO2 100% Physical  Exam  Nursing note and vitals reviewed. Constitutional: He is oriented to person, place, and time. He appears well-developed and well-nourished. No distress.  HENT:  Head: Normocephalic and atraumatic.  Eyes: Conjunctivae are normal.  Neck: Normal range of motion.  Cardiovascular: Normal rate and regular rhythm.  Exam reveals no gallop and no friction rub.   No murmur heard. Pulmonary/Chest: Effort normal and breath sounds normal. He has no wheezes. He has no rales. He exhibits no tenderness.  Abdominal: Soft.  He exhibits no distension. There is no tenderness. There is no rebound.  Musculoskeletal: Normal range of motion.  Lumbar spine tenderness to palpation at L4-L5 with associated paraspinal tenderness on the left side.   Neurological: He is alert and oriented to person, place, and time. Coordination normal.  Speech is goal-oriented. Moves limbs without ataxia.   Skin: Skin is warm and dry.  Psychiatric: He has a normal mood and affect. His behavior is normal.    ED Course  Procedures (including critical care time) Labs Review Labs Reviewed - No data to display  Imaging Review No results found.   EKG Interpretation None      MDM   Final diagnoses:  Chronic back pain   9:05 AM Patient will have 2 Percocet here and short course of oxycodone prescription. Patient advised to follow up with PCP and pain management. Vitals stable and patient afebrile. No bladder/bowel incontinence or saddle paresthesias.     Emilia BeckKaitlyn Ellanie Oppedisano, [place] JerseyPA-C 05/23/13 [phone]

## 2013-05-25 NOTE — ED Provider Notes (Signed)
Medical screening examination/treatment/procedure(s) were performed by non-physician practitioner and as supervising physician I was immediately available for consultation/collaboration.   EKG Interpretation None        Laray AngerKathleen M Siya Flurry, DO 05/25/13 2048

## 2013-06-07 ENCOUNTER — Ambulatory Visit: Payer: Medicaid Other | Admitting: Internal Medicine

## 2016-07-14 ENCOUNTER — Ambulatory Visit (INDEPENDENT_AMBULATORY_CARE_PROVIDER_SITE_OTHER): Payer: Medicaid Other

## 2016-07-14 ENCOUNTER — Encounter: Payer: Self-pay | Admitting: Podiatry

## 2016-07-14 ENCOUNTER — Ambulatory Visit: Payer: Medicaid Other

## 2016-07-14 ENCOUNTER — Ambulatory Visit (INDEPENDENT_AMBULATORY_CARE_PROVIDER_SITE_OTHER): Payer: Medicaid Other | Admitting: Podiatry

## 2016-07-14 DIAGNOSIS — S93104A Unspecified dislocation of right toe(s), initial encounter: Secondary | ICD-10-CM

## 2016-07-14 DIAGNOSIS — M79671 Pain in right foot: Secondary | ICD-10-CM

## 2016-07-14 DIAGNOSIS — M19072 Primary osteoarthritis, left ankle and foot: Secondary | ICD-10-CM

## 2016-07-14 DIAGNOSIS — M79672 Pain in left foot: Secondary | ICD-10-CM

## 2016-07-15 NOTE — Progress Notes (Signed)
   HPI: Patient is a 46 year old male presenting today for pain to the right fifth toe onset 1.5 weeks ago. He states he got the toe stuck under a piece of wood causing it to be twisted. He reports twisting the toe "back into place". He also reports left dorsal arch pain s/p osteotomy in 2014. He is here for further evaluation and treatment.    Physical Exam: General: The patient is alert and oriented x3 in no acute distress.  Dermatology: Skin is warm, dry and supple bilateral lower extremities. Negative for open lesions or macerations.  Vascular: Palpable pedal pulses bilaterally. No edema or erythema noted. Capillary refill within normal limits.  Neurological: Epicritic and protective threshold grossly intact bilaterally.   Musculoskeletal Exam: Pain with palpation to the left  midfoot. Range of motion within normal limits to all pedal and ankle joints bilateral. Muscle strength 5/5 in all groups bilateral.   Radiographic Exam:  Diffuse DJD/degenerative changes throughout the left midfoot.    Assessment: 1. Left foot DJD/arthritis 2. History of right fifth toe trauma   Plan of Care:  1. Patient was evaluated. X-rays reviewed. 2. Recommended OTC insoles to support left midfoot DJD. 3. Metatarsal pads dispensed. 4. Patient does not want anti-inflammatory injections. 5. Return to clinic when necessary.  Felecia ShellingBrent M. Evans, DPM [facility]  Dr. Felecia ShellingBrent M. Evans, DPM    [address]                                        [place], [place] [address]                Office [phone]  Fax [phone]

## 2016-08-09 ENCOUNTER — Telehealth: Payer: Self-pay | Admitting: Podiatry

## 2016-08-09 NOTE — Telephone Encounter (Signed)
Yes, my name is Glorieta SinkRita and I am calling from attorney [place]had Brown's office. I'm about to send over a request of records and we need them back ASAP as the Sharee PimpleJudge is requesting to review them and make a decision. If you could fax those back to us at [phone].

## 2016-08-12 ENCOUNTER — Ambulatory Visit (INDEPENDENT_AMBULATORY_CARE_PROVIDER_SITE_OTHER): Payer: Medicaid Other | Admitting: Orthopaedic Surgery

## 2016-08-12 ENCOUNTER — Ambulatory Visit (INDEPENDENT_AMBULATORY_CARE_PROVIDER_SITE_OTHER): Payer: Medicaid Other

## 2016-08-12 DIAGNOSIS — G8929 Other chronic pain: Secondary | ICD-10-CM

## 2016-08-12 DIAGNOSIS — M549 Dorsalgia, unspecified: Secondary | ICD-10-CM | POA: Insufficient documentation

## 2016-08-12 DIAGNOSIS — M545 Low back pain, unspecified: Secondary | ICD-10-CM

## 2016-08-12 DIAGNOSIS — M542 Cervicalgia: Secondary | ICD-10-CM | POA: Diagnosis not present

## 2016-08-12 NOTE — Progress Notes (Signed)
Office Visit Note   Patient: [name] [name]           Date of Birth: 10/27/1970           MRN: [id number] Visit Date: 08/12/2016              Requested by: No referring provider defined for this encounter. PCP: Aida PufferLittle, James, MD   Assessment & Plan: Visit Diagnoses:  1. Chronic midline low back pain without sciatica   2. Mid back pain   3. Neck pain     Plan: His arthritis in his cervical, thoracic, and lumbar spine is still stable. I can see how is still does cause debilitating pain but it does not help but he is also on chronic narcotics did he needs to function on a daily basis. He continues his Lyrica as well. I have no further recommendations other than weight loss. Low impact aerobic activities to assist with that such as swimming. I counseled him about narcotics again. 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 All questions were encouraged and answered. We'll see him back in follow-up in a year.  Follow-Up Instructions: Return in about 1 year (around 08/12/2017).   Orders:  Orders Placed This Encounter  Procedures  . XR Lumbar Spine 2-3 Views  . XR Thoracic Spine 2 View  . XR Cervical Spine 2 or 3 views   No orders of the defined types were placed in this encounter.     Procedures: No procedures performed   Clinical Data: No additional findings.   Subjective: No chief complaint on file. The patient is someone have not seen in a year. He comes for her yearly visit for a check his cervical, thoracic, and lumbar spine due to chronic pain from degenerative changes in these areas. He is on chronic narcotic pain medications provided by another physician and apparently needs yearly updates from her orthopedic standpoint and as to on examination of his neck and back and to review radiographs to see if anything is change dramatically and whether or not the  patient should remain on chronic narcotics.His primary care physician again as we see him yearly due to the fact that he is on high quantity/dosage of oxycodone. He says that he still needs this medication to help him function daily. He has gained at least 30 pounds since foot surgery her earlier in the year.  HPI  Review of Systems He currently denies any acute changes in health status. He denies any headache, chest pain, shortness of breath, fever, chills, nausea, vomiting.  Objective: Vital Signs: There were no vitals taken for this visit.  Physical Exam He has put on some weight. He ambulates very slowly with a slight limp. Ortho Exam On exam he has full but painful range of motion of his cervical thoracic and lumbar spines but no gross deformities. There is no radicular components of the upper and lower extremities. Specialty Comments:  No specialty comments available.  Imaging: Xr Thoracic Spine 2 View  Result Date: 08/12/2016 2 views of the thoracic spine show normal alignment overall. There is only mild to minimal degenerative changes.  Xr Cervical Spine 2 Or 3 Views  Result Date: 08/12/2016 2 views of the cervical spine show no acute changes. There is mild arthritic changes and degenerative disease at several levels. This compared to previous films were year  ago and is been no interval change.  Xr Lumbar Spine 2-3 Views  Result Date: 08/12/2016 2 views of the lumbar spine show no acute changes. These were compared to previous films from a year ago. He shows degenerative disc disease at several levels but no malalignment. There is been no interval changes of the year.    PMFS History: Patient Active Problem List   Diagnosis Date Noted  . Neck pain 08/12/2016  . Mid back pain 08/12/2016  . Essential hypertension, benign 05/07/2013  . Chronic low back pain 05/07/2013  . Chronic pain in left foot 05/07/2013  . Status post foot surgery 05/30/2012  . Deformity of  metatarsal 05/15/2012  . Pain in joint, ankle and foot 05/03/2012  . Degeneration of lumbar or lumbosacral intervertebral disc 04/13/2011  . Lumbar spondylosis 04/13/2011   Past Medical History:  Diagnosis Date  . Back pain, chronic   . Hypertension     Family History  Problem Relation Age of Onset  . Cancer Mother   . Hypertension Mother   . Hypertension Father   . Diabetes Father   . Diabetes Maternal Uncle     Past Surgical History:  Procedure Laterality Date  . COTTON OSTEOTOMY WITH GRAFT Left 05/25/2012   @ [facility]  . HAND SURGERY    . KNEE ARTHROSCOPY    . TONSILLECTOMY     Social History   Occupational History  . Not on file.   Social History Main Topics  . Smoking status: Never Smoker  . Smokeless tobacco: Never Used  . Alcohol use No     Comment: occasionally  . Drug use: No  . Sexual activity: Not on file

## 2016-08-19 ENCOUNTER — Telehealth (INDEPENDENT_AMBULATORY_CARE_PROVIDER_SITE_OTHER): Payer: Self-pay | Admitting: Orthopaedic Surgery

## 2016-08-19 NOTE — Telephone Encounter (Signed)
08/12/2016 OV note faxed to Atty [place]had Brown's office

## 2016-09-03 ENCOUNTER — Telehealth (INDEPENDENT_AMBULATORY_CARE_PROVIDER_SITE_OTHER): Payer: Self-pay | Admitting: Orthopaedic Surgery

## 2016-09-03 NOTE — Telephone Encounter (Signed)
Patient called asked if he can get a copy of his office notes from last visit  08/12/16. Patient said he will come to pick up notes. Patient said the notes are left at the from desk for him. The number to contact patient is [phone]

## 2016-09-07 NOTE — Telephone Encounter (Signed)
Needs to sign release form for copy of records.I tried to call pt twice. Phone didn't ring..no sound

## 2016-09-08 ENCOUNTER — Telehealth (INDEPENDENT_AMBULATORY_CARE_PROVIDER_SITE_OTHER): Payer: Self-pay | Admitting: Orthopaedic Surgery

## 2016-09-08 NOTE — Telephone Encounter (Signed)
Called patient advised he need to complete a medical records release form in order  to pick up records  [phone]

## 2017-08-17 ENCOUNTER — Ambulatory Visit (INDEPENDENT_AMBULATORY_CARE_PROVIDER_SITE_OTHER): Payer: Medicaid Other | Admitting: Orthopaedic Surgery

## 2019-10-16 ENCOUNTER — Encounter (HOSPITAL_COMMUNITY): Payer: Self-pay | Admitting: Emergency Medicine

## 2019-10-16 ENCOUNTER — Emergency Department (HOSPITAL_COMMUNITY): Payer: Medicaid Other

## 2019-10-16 ENCOUNTER — Emergency Department (HOSPITAL_COMMUNITY)
Admission: EM | Admit: 2019-10-16 | Discharge: 2019-10-17 | Disposition: A | Discharge status: Home / Self Care | Payer: Medicaid Other | Attending: Emergency Medicine | Admitting: Emergency Medicine

## 2019-10-16 ENCOUNTER — Other Ambulatory Visit: Payer: Self-pay

## 2019-10-16 DIAGNOSIS — W2209XA Striking against other stationary object, initial encounter: Secondary | ICD-10-CM | POA: Diagnosis not present

## 2019-10-16 DIAGNOSIS — Z79899 Other long term (current) drug therapy: Secondary | ICD-10-CM | POA: Diagnosis not present

## 2019-10-16 DIAGNOSIS — I10 Essential (primary) hypertension: Secondary | ICD-10-CM | POA: Insufficient documentation

## 2019-10-16 DIAGNOSIS — S60221A Contusion of right hand, initial encounter: Secondary | ICD-10-CM | POA: Insufficient documentation

## 2019-10-16 DIAGNOSIS — S60921A Unspecified superficial injury of right hand, initial encounter: Secondary | ICD-10-CM | POA: Diagnosis present

## 2019-10-16 DIAGNOSIS — S60511A Abrasion of right hand, initial encounter: Secondary | ICD-10-CM | POA: Insufficient documentation

## 2019-10-16 DIAGNOSIS — M79641 Pain in right hand: Secondary | ICD-10-CM

## 2019-10-16 MED ORDER — BACITRACIN ZINC 500 UNIT/GM EX OINT
TOPICAL_OINTMENT | CUTANEOUS | Status: AC
  Filled 2019-10-16: qty 0.9

## 2019-10-16 NOTE — ED Triage Notes (Signed)
Patient right hand by swatting a bee because one had already stung him. Patient ended up hitting a pole. This happened today.

## 2019-10-16 NOTE — Discharge Instructions (Signed)
Your x-ray today did not show evidence of fracture however clinically I am concerned about 1/5 metacarpal fracture called a boxer fracture.  Your abrasion was washed and did not show a deep laceration.  We covered it with the antibiotic ointment.  Please watch for signs and symptoms of infection.  Your tetanus is up-to-date.  Please keep the hand in the splint and follow-up with your hand surgeon for further evaluation and management.  If any symptoms change or worsen, please return to the nearest emergency department.

## 2019-10-16 NOTE — ED Provider Notes (Signed)
[facility]-EMERGENCY DEPT Provider Note   CSN: [id number] Arrival date & time: 10/16/19  1936     History Chief Complaint  Patient presents with  . Hand Injury    Rayetta PiggJonathan R Eischeid is a 49 y.o. male.  The history is provided by the patient and medical records. No language interpreter was used.  Hand Injury Location:  Hand Hand location:  R hand Injury: yes   Mechanism of injury comment:  Hit agaist a post Pain details:    Quality:  Aching   Radiates to:  Does not radiate   Severity:  Moderate   Onset quality:  Sudden   Timing:  Constant   Progression:  Unchanged Handedness:  Right-handed Dislocation: no   Foreign body present:  No foreign bodies Tetanus status:  Up to date Prior injury to area:  Yes Relieved by:  Nothing Worsened by:  Movement Ineffective treatments:  None tried Associated symptoms: swelling and tingling   Associated symptoms: no back pain, no decreased range of motion, no fatigue, no fever, no muscle weakness, no neck pain, no numbness and no stiffness         Past Medical History:  Diagnosis Date  . Back pain, chronic   . Hypertension     Patient Active Problem List   Diagnosis Date Noted  . Neck pain 08/12/2016  . Mid back pain 08/12/2016  . Essential hypertension, benign 05/07/2013  . Chronic low back pain 05/07/2013  . Chronic pain in left foot 05/07/2013  . Status post foot surgery 05/30/2012  . Deformity of metatarsal 05/15/2012  . Pain in joint, ankle and foot 05/03/2012  . Degeneration of lumbar or lumbosacral intervertebral disc 04/13/2011  . Lumbar spondylosis 04/13/2011    Past Surgical History:  Procedure Laterality Date  . COTTON OSTEOTOMY WITH GRAFT Left 05/25/2012   @ [facility]  . HAND SURGERY    . KNEE ARTHROSCOPY    . TONSILLECTOMY         Family History  Problem Relation Age of Onset  . Cancer Mother   . Hypertension Mother   . Hypertension Father   . Diabetes Father   . Diabetes  Maternal Uncle     Social History   Tobacco Use  . Smoking status: Never Smoker  . Smokeless tobacco: Never Used  Substance Use Topics  . Alcohol use: No    Comment: occasionally  . Drug use: No    Home Medications Prior to Admission medications   Medication Sig Start Date End Date Taking? Authorizing Provider  amphetamine-dextroamphetamine (ADDERALL) 30 MG tablet Take 30 mg by mouth daily.    [provider]  aspirin EC 81 MG tablet Take 81 mg by mouth every morning.     [provider]  lisinopril-hydrochlorothiazide (PRINZIDE,ZESTORETIC) 20-25 MG per tablet Take 1 tablet by mouth every morning.    [provider]  LYRICA 100 MG capsule TAKE 1 TAB BY MOUTH TWICE A DAY 05/05/16   [provider]  MethylPREDNISolone (MEDROL PO) Take by mouth.    [provider]  Oxycodone HCl 20 MG TABS Take 1 tablet (20 mg total) by mouth every 4 (four) hours as needed. 05/23/13   Emilia BeckSzekalski, Kaitlyn, PA-C  Vitamin D, Ergocalciferol, (DRISDOL) 50000 UNITS CAPS capsule Take 1 capsule (50,000 Units total) by mouth every 7 (seven) days. 05/08/13   Doris CheadleAdvani, Deepak, MD    Allergies    Ibuprofen, Nsaids, and Ultram [tramadol hcl]  Review of Systems   Review  of Systems  Constitutional: Negative for diaphoresis, fatigue and fever.  HENT: Negative for congestion, sore throat, trouble swallowing and voice change.   Respiratory: Negative for cough, chest tightness, shortness of breath and wheezing.   Cardiovascular: Negative for chest pain.  Gastrointestinal: Negative for abdominal pain, constipation, diarrhea, nausea and vomiting.  Genitourinary: Negative for flank pain.  Musculoskeletal: Negative for back pain, neck pain, neck stiffness and stiffness.  Skin: Positive for color change (bruising) and wound.  Neurological: Negative for headaches.  Psychiatric/Behavioral: Negative for agitation.  All other systems reviewed and are negative.   Physical  Exam Updated Vital Signs BP 96/84 (BP Location: Left Arm)   Pulse 95   Temp 98.2 F (36.8 C) (Oral)   Resp 16   Ht 6' (1.829 m)   Wt 93 kg   SpO2 98%   BMI 27.80 kg/m   Physical Exam Vitals and nursing note reviewed.  Constitutional:      General: He is not in acute distress.    Appearance: He is well-developed. He is not ill-appearing, toxic-appearing or diaphoretic.  HENT:     Head: Normocephalic and atraumatic.     Nose: Nose normal. No congestion or rhinorrhea.     Mouth/Throat:     Mouth: Mucous membranes are moist.     Pharynx: No oropharyngeal exudate or posterior oropharyngeal erythema.  Eyes:     Conjunctiva/sclera: Conjunctivae normal.     Pupils: Pupils are equal, round, and reactive to light.  Cardiovascular:     Rate and Rhythm: Normal rate and regular rhythm.     Heart sounds: No murmur heard.   Pulmonary:     Effort: Pulmonary effort is normal. No respiratory distress.     Breath sounds: Normal breath sounds. No wheezing, rhonchi or rales.  Chest:     Chest wall: No tenderness.  Abdominal:     General: Abdomen is flat.     Palpations: Abdomen is soft.     Tenderness: There is no abdominal tenderness. There is no right CVA tenderness, left CVA tenderness, guarding or rebound.  Musculoskeletal:        General: Swelling, tenderness and signs of injury present.     Right hand: Swelling, tenderness and bony tenderness present. No lacerations (abrasion present). Normal range of motion. Normal strength. Normal sensation. Normal capillary refill. Normal pulse.     Cervical back: Neck supple.     Right lower leg: No edema.     Left lower leg: No edema.     Comments: Swelling and tenderness to right hypothenar area.  Small area of abrasion with some bleeding that is hemostatic now.  Normal capillary refill, strength, and range of motion.  No tenderness in the wrist or snuffbox.  Some mild tingling distal to the injury.  Skin:    General: Skin is warm and dry.      Capillary Refill: Capillary refill takes less than 2 seconds.     Findings: Bruising present.  Neurological:     General: No focal deficit present.     Mental Status: He is alert.     Sensory: No sensory deficit.     Motor: No weakness.  Psychiatric:        Mood and Affect: Mood normal.     ED Results / Procedures / Treatments   Labs (all labs ordered are listed, but only abnormal results are displayed) Labs Reviewed - No data to display  EKG None  Radiology DG Hand Complete Right  Result  Date: 10/16/2019 CLINICAL DATA:  Right hand pain after injury. Hand hit a pole while he was sweating at a bee. Laceration and swelling about the fifth metacarpal. EXAM: RIGHT HAND - COMPLETE 3+ VIEW COMPARISON:  None. FINDINGS: There is no evidence of fracture or dislocation. There is no evidence of arthropathy or other focal bone abnormality. Soft tissue edema about the fifth metacarpal. No tracking soft tissue air or radiopaque foreign body. IMPRESSION: Soft tissue edema. No fracture or subluxation. Electronically Signed   By: Narda Rutherford M.D.   On: 10/16/2019 20:50    Procedures Procedures (including critical care time)  Medications Ordered in ED Medications  bacitracin 500 UNIT/GM ointment (has no administration in time range)    ED Course  I have reviewed the triage vital signs and the nursing notes.  Pertinent labs & imaging results that were available during my care of the patient were reviewed by me and considered in my medical decision making (see chart for details).    MDM Rules/Calculators/A&P                          RICHARDS PHERIGO is a 49 y.o. right handed male with a past medical history significant for hypertension, chronic pain, and prior hand surgery who presents with a yellowjacket sting and right hand injury.  Patient reports that today, he was sitting on a porch when a yellowjacket tried to bother him.  He says that it got down his shirt and bit or stung him on  the upper back.  He then had another one coming area states that he tried to swing his arm at it but hit a vertical wooden post with the hyperthenar area of his hand.  Patient had immediate onset of pain and swelling and had a small area of bleeding.  He presents for evaluation for further management.  On exam, patient does have swelling and tenderness to the hypothenar area of his right hand.  He is right-hand dominant.  He has pain with finger movement but can move it with full range of motion.  He has some tingling but does have sensation in the finger.  Good capillary refill.  Good pulses.  No snuffbox tenderness and no wrist tenderness.  No limiting range of motion in the wrist.  Exam otherwise unremarkable.  I did not see evidence of a large sting or any evidence of allergic reaction from the sting.  X-ray was obtained showing no acute fracture or dislocation.  Soft tissue swelling was seen.  Patient does report that he has had injury in his right hand with multiple fractures that did not show up on initial x-ray.  I am concerned about occult injury.  Due to the possible fifth metacarpal injury with swelling and pain, we will place him in an ulnar gutter splint and he will follow up with his hand surgeon.  We covered his abrasion with bacitracin and there was no laceration to be stitched.  Patient agrees with plan of care and follow-up instructions.  He understood return precautions.  His tetanus is up-to-date.  Patient discharged in good condition after splint was applied.   Final Clinical Impression(s) / ED Diagnoses Final diagnoses:  Right hand pain  Abrasion of palm of right hand, initial encounter  Contusion of right hand, initial encounter    Rx / DC Orders ED Discharge Orders    None     Clinical Impression: 1. Right hand pain  2. Abrasion of palm of right hand, initial encounter   3. Contusion of right hand, initial encounter     Disposition: Discharge  Condition:  Good  I have discussed the results, Dx and Tx plan with the pt(& family if present). He/she/they expressed understanding and agree(s) with the plan. Discharge instructions discussed at great length. Strict return precautions discussed and pt &/or family have verbalized understanding of the instructions. No further questions at time of discharge.    New Prescriptions   No medications on file    Follow Up: [facility] [address] [place] [address] [phone]     [facility] [address] [id number] mc [address] [place] [address] [phone]       Isiac Breighner, Canary Brim, MD 10/16/19 [phone]

## 2019-10-17 NOTE — Progress Notes (Signed)
Orthopedic Tech Progress Note Patient Details:  JAROME TRULL May 02, 1970 [id number]  Ortho Devices Type of Ortho Device: Ulna gutter splint Ortho Device/Splint Location: Right Upper Extremity Ortho Device/Splint Interventions: Ordered, Application   Post Interventions Patient Tolerated: Well Instructions Provided: Adjustment of device, Care of device, Poper ambulation with device   Laporsha Grealish P Harle Stanford 10/17/2019, 12:53 AM

## 2022-06-19 ENCOUNTER — Emergency Department (HOSPITAL_BASED_OUTPATIENT_CLINIC_OR_DEPARTMENT_OTHER): Payer: Medicaid Other

## 2022-06-19 DIAGNOSIS — I10 Essential (primary) hypertension: Secondary | ICD-10-CM | POA: Insufficient documentation

## 2022-06-19 DIAGNOSIS — S6992XA Unspecified injury of left wrist, hand and finger(s), initial encounter: Secondary | ICD-10-CM | POA: Insufficient documentation

## 2022-06-19 DIAGNOSIS — M25532 Pain in left wrist: Secondary | ICD-10-CM | POA: Diagnosis present

## 2022-06-19 DIAGNOSIS — X58XXXA Exposure to other specified factors, initial encounter: Secondary | ICD-10-CM | POA: Diagnosis not present

## 2022-06-19 DIAGNOSIS — Z79899 Other long term (current) drug therapy: Secondary | ICD-10-CM | POA: Diagnosis not present

## 2022-06-19 NOTE — ED Triage Notes (Signed)
Pt here for L wrist pain. Pt reports 2 weeks ago he had a dog leash wrapped around his wrist, and his dog jumped forward and pt heard a "pop" in his L wrist. Pt reports that pain in wrist has continued, worse w/ pronation. Pt has limited ROM, full sensation, 2+ radial pulse.

## 2022-06-20 ENCOUNTER — Emergency Department (HOSPITAL_BASED_OUTPATIENT_CLINIC_OR_DEPARTMENT_OTHER)
Admission: EM | Admit: 2022-06-20 | Discharge: 2022-06-20 | Disposition: A | Discharge status: Home / Self Care | Payer: Medicaid Other | Attending: Emergency Medicine | Admitting: Emergency Medicine

## 2022-06-20 DIAGNOSIS — S6992XA Unspecified injury of left wrist, hand and finger(s), initial encounter: Secondary | ICD-10-CM

## 2022-06-20 NOTE — ED Notes (Signed)
ED Provider at bedside. 

## 2022-06-20 NOTE — ED Provider Notes (Signed)
[facility] Provider Note: [name] Dell, MD, FACEP  CSN: [id number] MRN: [id number] ARRIVAL: 06/19/22 at 2245 ROOM: DB011/DB011   CHIEF COMPLAINT  Wrist Pain   HISTORY OF PRESENT ILLNESS  06/20/22 2:01 AM [name] [name] is a 52 y.o. male who had a dog leash wrapped around his wrist about 2 weeks ago.  His dog jumped forward and he felt a pop in his left wrist.  He has had persistent pain in the left wrist that is continued worse with movement, especially pronation and supination.  He has limited range of motion of the left wrist but normal sensation and movement in his left hand.  He cannot take NSAIDs due to stomach problems.  He is a chronic pain patient who receives 120, 20 mg oxycodone tablets monthly   Past Medical History:  Diagnosis Date   Back pain, chronic    Hypertension     Past Surgical History:  Procedure Laterality Date   COTTON OSTEOTOMY WITH GRAFT Left 05/25/2012   @ [facility]   HAND SURGERY     KNEE ARTHROSCOPY     TONSILLECTOMY      Family History  Problem Relation Age of Onset   Cancer Mother    Hypertension Mother    Hypertension Father    Diabetes Father    Diabetes Maternal Uncle     Social History   Tobacco Use   Smoking status: Never   Smokeless tobacco: Never  Substance Use Topics   Alcohol use: No    Comment: occasionally   Drug use: No    Prior to Admission medications   Medication Sig Start Date End Date Taking? Authorizing Provider  amphetamine-dextroamphetamine (ADDERALL) 30 MG tablet Take 30 mg by mouth daily.    [provider]  aspirin EC 81 MG tablet Take 81 mg by mouth every morning.     [provider]  lisinopril-hydrochlorothiazide (PRINZIDE,ZESTORETIC) 20-25 MG per tablet Take 1 tablet by mouth every morning.    [provider]  LYRICA 100 MG capsule TAKE 1 TAB BY MOUTH TWICE A DAY 05/05/16   [provider]  MethylPREDNISolone (MEDROL PO) Take by mouth.    [provider]  Oxycodone HCl 20 MG TABS Take 1 tablet (20 mg total) by mouth every 4 (four) hours as needed. 05/23/13   Emilia Beck, PA-C  Vitamin D, Ergocalciferol, (DRISDOL) 50000 UNITS CAPS capsule Take 1 capsule (50,000 Units total) by mouth every 7 (seven) days. 05/08/13   Doris Cheadle, MD    Allergies Ibuprofen, Nsaids, and Ultram [tramadol hcl]   REVIEW OF SYSTEMS  Negative except as noted here or in the History of Present Illness.   PHYSICAL EXAMINATION  Initial Vital Signs Blood pressure 109/64, pulse 99, temperature 97.9 F (36.6 C), temperature source Oral, resp. rate 18, height 6' 0.25" (1.835 m), weight 90.7 kg, SpO2 100 %.  Examination General: Well-developed, well-nourished male in no acute distress; appearance consistent with age of record HENT: normocephalic; atraumatic Eyes: Normal appearance Neck: supple Heart: regular rate and rhythm Lungs: clear to auscultation bilaterally Abdomen: soft; nondistended; nontender; bowel sounds present Extremities: No deformity; tenderness over the ulnar aspect of the left wrist and distal forearm; patient has intact movement in all directions but range of motion is limited and exacerbates pain Neurologic: Awake, alert and oriented; motor function intact in all extremities and symmetric; no facial droop Skin: Warm and dry Psychiatric: Normal mood and affect   RESULTS  Summary of this visit's results,  reviewed and interpreted by myself:   EKG Interpretation  Date/Time:    Ventricular Rate:    PR Interval:    QRS Duration:   QT Interval:    QTC Calculation:   R Axis:     Text Interpretation:         Laboratory Studies: No results found for this or any previous visit (from the past 24 hour(s)). Imaging Studies: DG Wrist Complete Left  Result Date: 06/19/2022 CLINICAL DATA:  Continued pain after injury left wrist pain. Heard a pop. EXAM: LEFT WRIST - COMPLETE 3+ VIEW COMPARISON:  None Available. FINDINGS: There is no  evidence of fracture or dislocation. Alignment is maintained. There is no evidence of arthropathy or other focal bone abnormality. Arterial vascular calcifications. Soft tissues are unremarkable. IMPRESSION: No fracture or dislocation of the left wrist. Electronically Signed   By: Narda Rutherford M.D.   On: 06/19/2022 23:41    ED COURSE and MDM  Nursing notes, initial and subsequent vitals signs, including pulse oximetry, reviewed and interpreted by myself.  Vitals:   06/19/22 2301 06/19/22 2308 06/20/22 0115  BP: (!) 141/82  109/64  Pulse: 99  99  Resp: 18  18  Temp: 97.9 F (36.6 C)    TempSrc: Oral    SpO2: 98%  100%  Weight:  90.7 kg   Height:  6' 0.25" (1.835 m)    Medications - No data to display  We will place the patient's wrist in a Velcro splint and refer to hand surgery.  He is already on chronic narcotics for back pain.  As noted above he cannot take NSAIDs.  PROCEDURES  Procedures   ED DIAGNOSES     ICD-10-CM   1. Wrist injury, left, initial encounter  S69.92XA          Amram Maya, Jonny Ruiz, MD 06/20/22 [phone]

## 2023-01-30 ENCOUNTER — Emergency Department (HOSPITAL_COMMUNITY): Payer: Medicaid Other

## 2023-01-30 ENCOUNTER — Encounter (HOSPITAL_COMMUNITY): Payer: Self-pay | Admitting: Emergency Medicine

## 2023-01-30 ENCOUNTER — Emergency Department (HOSPITAL_COMMUNITY)
Admission: EM | Admit: 2023-01-30 | Discharge: 2023-01-30 | Disposition: A | Discharge status: Home / Self Care | Payer: Medicaid Other | Attending: Emergency Medicine | Admitting: Emergency Medicine

## 2023-01-30 DIAGNOSIS — Y92512 Supermarket, store or market as the place of occurrence of the external cause: Secondary | ICD-10-CM | POA: Diagnosis not present

## 2023-01-30 DIAGNOSIS — S0990XA Unspecified injury of head, initial encounter: Secondary | ICD-10-CM | POA: Insufficient documentation

## 2023-01-30 DIAGNOSIS — W01198A Fall on same level from slipping, tripping and stumbling with subsequent striking against other object, initial encounter: Secondary | ICD-10-CM | POA: Insufficient documentation

## 2023-01-30 NOTE — ED Provider Notes (Addendum)
 [facility]-EMERGENCY DEPT [facility] Emergency Department Provider Note MRN:  [id number]  Arrival date & time: 01/30/23     Chief Complaint   Head Injury   History of Present Illness   [name] [name] is a 53 y.o. year-old male presents to the ED with chief complaint of head injury.  Injury occurred 4-5 days ago.  States that he was lifting a bag of concrete and lost his balance and fell while at [employer] and hit his head on a metal cart.  He didn't pass out.  Denies any nausea or vomiting.  States that he felt briefly disoriented.  SABRA  History provided by patient.   Review of Systems  Pertinent positive and negative review of systems noted in HPI.    Physical Exam   Vitals:   01/30/23 0223  BP: (!) 134/107  Pulse: 87  Resp: 18  Temp: 97.7 F (36.5 C)  SpO2: 100%    CONSTITUTIONAL:  non toxic-appearing, NAD NEURO:  Alert and oriented x 3, CN 3-12 grossly intact EYES:  eyes equal and reactive ENT/NECK:  Supple, no stridor  CARDIO:  normal rate, appears well-perfused  PULM:  No respiratory distress,  GI/GU:  non-distended,  MSK/SPINE:  No gross deformities, no edema, moves all extremities, no cervical spine tenderness, step-off, or deformity. SKIN:  no rash, atraumatic   *Additional and/or pertinent findings included in MDM below  Diagnostic and Interventional Summary    EKG Interpretation Date/Time:    Ventricular Rate:    PR Interval:    QRS Duration:    QT Interval:    QTC Calculation:   R Axis:      Text Interpretation:         Labs Reviewed - No data to display  CT Head Wo Contrast  Final Result      Medications - No data to display   Procedures  /  Critical Care Procedures  ED Course and Medical Decision Making  I have reviewed the triage vital signs, the nursing notes, and pertinent available records from the EMR.  Social Determinants Affecting Complexity of Care: Patient has no clinically significant social determinants affecting this  chief complaint..   ED Course:    Medical Decision Making Patient here with headache after head injury about 4-5 days ago.  No LOC.  No weakness or numbness.    Wound appears to be well healing.  No sign of depressed skull fracture.    CT head ordered in triage is negative.  Nexus C-spine criteria cleared.    Will plan for discharge with PCP follow-up.  Amount and/or Complexity of Data Reviewed Radiology: ordered.         Consultants: No consultations were needed in caring for this patient.   Treatment and Plan: Emergency department workup does not suggest an emergent condition requiring admission or immediate intervention beyond  what has been performed at this time. The patient is safe for discharge and has  been instructed to return immediately for worsening symptoms, change in  symptoms or any other concerns    Final Clinical Impressions(s) / ED Diagnoses     ICD-10-CM   1. Injury of head, initial encounter  S09.90XA       ED Discharge Orders     None         Discharge Instructions Discussed with and Provided to Patient:   Discharge Instructions   None      Vicky Charleston, PA-C 01/30/23 0301    Vicky Charleston, PA-C  01/30/23 0306    Haze Lonni PARAS, MD 01/30/23 [phone]

## 2023-01-30 NOTE — ED Triage Notes (Addendum)
 Pt states he fell and hit head on metal cart in store a week ago. Denies blood thinner. States he has been having memory issues since. Denies ETOH use but speech seems delayed and left pupil sluggish. PT is on lyrica and has pain management MD

## 2023-02-09 ENCOUNTER — Encounter (HOSPITAL_COMMUNITY): Payer: Self-pay

## 2023-02-09 ENCOUNTER — Other Ambulatory Visit: Payer: Self-pay

## 2023-02-09 ENCOUNTER — Emergency Department (HOSPITAL_COMMUNITY)
Admission: EM | Admit: 2023-02-09 | Discharge: 2023-02-09 | Discharge status: Against Medical Advice | Payer: Medicaid Other | Attending: Student | Admitting: Student

## 2023-02-09 DIAGNOSIS — M542 Cervicalgia: Secondary | ICD-10-CM | POA: Diagnosis present

## 2023-02-09 DIAGNOSIS — Z5321 Procedure and treatment not carried out due to patient leaving prior to being seen by health care provider: Secondary | ICD-10-CM | POA: Insufficient documentation

## 2023-02-09 LAB — CBC WITH DIFFERENTIAL/PLATELET
Abs Immature Granulocytes: 0.02 10*3/uL (ref 0.00–0.07)
Basophils Absolute: 0 10*3/uL (ref 0.0–0.1)
Basophils Relative: 1 %
Eosinophils Absolute: 0.3 10*3/uL (ref 0.0–0.5)
Eosinophils Relative: 4 %
HCT: 42.4 % (ref 39.0–52.0)
Hemoglobin: 14.2 g/dL (ref 13.0–17.0)
Immature Granulocytes: 0 %
Lymphocytes Relative: 33 %
Lymphs Abs: 2.5 10*3/uL (ref 0.7–4.0)
MCH: 30 pg (ref 26.0–34.0)
MCHC: 33.5 g/dL (ref 30.0–36.0)
MCV: 89.5 fL (ref 80.0–100.0)
Monocytes Absolute: 0.9 10*3/uL (ref 0.1–1.0)
Monocytes Relative: 12 %
Neutro Abs: 3.9 10*3/uL (ref 1.7–7.7)
Neutrophils Relative %: 50 %
Platelets: 283 10*3/uL (ref 150–400)
RBC: 4.74 MIL/uL (ref 4.22–5.81)
RDW: 13.8 % (ref 11.5–15.5)
WBC: 7.8 10*3/uL (ref 4.0–10.5)
nRBC: 0 % (ref 0.0–0.2)

## 2023-02-09 LAB — URINALYSIS, W/ REFLEX TO CULTURE (INFECTION SUSPECTED)
Bacteria, UA: NONE SEEN
Bilirubin Urine: NEGATIVE
Glucose, UA: NEGATIVE mg/dL
Hgb urine dipstick: NEGATIVE
Ketones, ur: NEGATIVE mg/dL
Leukocytes,Ua: NEGATIVE
Nitrite: NEGATIVE
Protein, ur: NEGATIVE mg/dL
Specific Gravity, Urine: 1.017 (ref 1.005–1.030)
pH: 6 (ref 5.0–8.0)

## 2023-02-09 LAB — COMPREHENSIVE METABOLIC PANEL
ALT: 8 U/L (ref 0–44)
AST: 16 U/L (ref 15–41)
Albumin: 3.8 g/dL (ref 3.5–5.0)
Alkaline Phosphatase: 60 U/L (ref 38–126)
Anion gap: 7 (ref 5–15)
BUN: 14 mg/dL (ref 6–20)
CO2: 33 mmol/L — ABNORMAL HIGH (ref 22–32)
Calcium: 9.3 mg/dL (ref 8.9–10.3)
Chloride: 97 mmol/L — ABNORMAL LOW (ref 98–111)
Creatinine, Ser: 0.42 mg/dL — ABNORMAL LOW (ref 0.61–1.24)
GFR, Estimated: >60 mL/min (ref >60)
Glucose, Bld: 113 mg/dL — ABNORMAL HIGH (ref 70–99)
Potassium: 3.2 mmol/L — ABNORMAL LOW (ref 3.5–5.1)
Sodium: 137 mmol/L (ref 135–145)
Total Bilirubin: 0.5 mg/dL (ref 0.0–1.2)
Total Protein: 6.8 g/dL (ref 6.5–8.1)

## 2023-02-09 NOTE — ED Provider Notes (Signed)
Patient left without being seen.  I did not see or evaluate this patient.   Glendora Score, MD 02/09/23 [phone]
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
## 2023-03-28 DIAGNOSIS — Z7982 Long term (current) use of aspirin: Secondary | ICD-10-CM | POA: Diagnosis not present

## 2023-03-28 DIAGNOSIS — R519 Headache, unspecified: Secondary | ICD-10-CM | POA: Diagnosis present

## 2023-03-28 DIAGNOSIS — M4802 Spinal stenosis, cervical region: Secondary | ICD-10-CM | POA: Diagnosis not present

## 2023-03-28 NOTE — ED Triage Notes (Signed)
 Here for a follow-up on a fall back in January.  Continues to have stiff neck and headaches-'low grade in the middle of my skull'.

## 2023-03-29 ENCOUNTER — Emergency Department (HOSPITAL_BASED_OUTPATIENT_CLINIC_OR_DEPARTMENT_OTHER)

## 2023-03-29 ENCOUNTER — Emergency Department (HOSPITAL_BASED_OUTPATIENT_CLINIC_OR_DEPARTMENT_OTHER)
Admission: EM | Admit: 2023-03-29 | Discharge: 2023-03-29 | Disposition: A | Discharge status: Home / Self Care | Attending: Emergency Medicine | Admitting: Emergency Medicine

## 2023-03-29 DIAGNOSIS — R519 Headache, unspecified: Secondary | ICD-10-CM

## 2023-03-29 DIAGNOSIS — G8929 Other chronic pain: Secondary | ICD-10-CM

## 2023-03-29 DIAGNOSIS — M4802 Spinal stenosis, cervical region: Secondary | ICD-10-CM

## 2023-03-29 MED ORDER — ACETAMINOPHEN 500 MG PO TABS
1000.0000 mg | ORAL_TABLET | Freq: Once | ORAL | Status: AC
  Administered 2023-03-29: 1000 mg via ORAL
  Filled 2023-03-29: qty 2

## 2023-03-29 NOTE — Discharge Instructions (Addendum)
 Your CT head and neck revealed: FINDINGS:  CT HEAD FINDINGS    Brain: No mass,hemorrhage or extra-axial collection. Normal  appearance of the parenchyma and CSF spaces.    Vascular: No hyperdense vessel or unexpected vascular calcification.    Skull: The visualized skull base, calvarium and extracranial soft  tissues are normal.    Sinuses/Orbits: No fluid levels or advanced mucosal thickening of  the visualized paranasal sinuses. No mastoid or middle ear effusion.  Normal orbits.    Other: None.    CT CERVICAL SPINE FINDINGS    Alignment: No static subluxation. Facets are aligned. Occipital  condyles are normally positioned.    Skull base and vertebrae: No acute fracture.    Soft tissues and spinal canal: No prevertebral fluid or swelling. No  visible canal hematoma.    Disc levels: Severe right C6 foraminal stenosis.    Upper chest: No pneumothorax, pulmonary nodule or pleural effusion.    Other: Normal visualized paraspinal cervical soft tissues.    IMPRESSION:  1. No acute intracranial abnormality.  2. No acute fracture or static subluxation of the cervical spine.  3. Severe right C6 foraminal stenosis.   Please follow-up with your PCP regarding the stenosis or narrowing in C6. There was no evidence of acute abnormality on your scans. Follow-up with your neurosurgeon regarding the cervical stenosis.

## 2023-03-29 NOTE — ED Provider Notes (Signed)
 [place] EMERGENCY DEPARTMENT AT [facility] Provider Note   CSN: [id number] Arrival date & time: 03/28/23  2133     History  No chief complaint on file.   [name] [name] is a 53 y.o. male.  HPI   53 year old male presenting to the emergency department with chronic headache and neck pain.  The patient states that he sustained a fall back in January.  He was seen in the emergency department on 01/30/2023 and hit his head on a metal cart.  He had CT imaging at the time that was reportedly negative.  Since the fall, he has had chronic daily headaches.  He also has had some neck pain in his upper cervical spine.  He states that he has had no facial droop, numbness or weakness, no weakness or numbness in the extremities other than occasional bilateral numbness down the arms.  He followed up outpatient with a neurosurgeon at [facility] and on review of his CT there had been some concern about his cervical spine but he was unable to get imaging outpatient.  He presents to the emergency department today requesting CT imaging of the head and cervical spine.  Home Medications Prior to Admission medications   Medication Sig Start Date End Date Taking? Authorizing Provider  amphetamine-dextroamphetamine (ADDERALL) 30 MG tablet Take 30 mg by mouth daily.    [provider]  aspirin EC 81 MG tablet Take 81 mg by mouth every morning.     [provider]  lisinopril-hydrochlorothiazide (PRINZIDE,ZESTORETIC) 20-25 MG per tablet Take 1 tablet by mouth every morning.    [provider]  LYRICA 100 MG capsule TAKE 1 TAB BY MOUTH TWICE A DAY 05/05/16   [provider]  MethylPREDNISolone (MEDROL PO) Take by mouth.    [provider]  Oxycodone HCl 20 MG TABS Take 1 tablet (20 mg total) by mouth every 4 (four) hours as needed. 05/23/13   Emilia Beck, PA-C  Vitamin D, Ergocalciferol, (DRISDOL) 50000 UNITS CAPS capsule Take 1 capsule (50,000 Units total)  by mouth every 7 (seven) days. 05/08/13   Doris Cheadle, MD      Allergies    Ibuprofen, Nsaids, and Ultram [tramadol hcl]    Review of Systems   Review of Systems  All other systems reviewed and are negative.   Physical Exam Updated Vital Signs BP 120/82   Pulse 79   Temp 98 F (36.7 C)   Resp 17   SpO2 100%  Physical Exam Vitals and nursing note reviewed.  Constitutional:      General: He is not in acute distress.    Appearance: He is well-developed.  HENT:     Head: Normocephalic and atraumatic.  Eyes:     Conjunctiva/sclera: Conjunctivae normal.  Neck:     Comments: Slight TTP of the upper cervical spine Cardiovascular:     Rate and Rhythm: Normal rate and regular rhythm.  Pulmonary:     Effort: Pulmonary effort is normal. No respiratory distress.     Breath sounds: Normal breath sounds.  Abdominal:     Palpations: Abdomen is soft.     Tenderness: There is no abdominal tenderness.  Musculoskeletal:        General: No swelling.     Cervical back: Neck supple.  Skin:    General: Skin is warm and dry.     Capillary Refill: Capillary refill takes less than 2 seconds.  Neurological:     Mental Status: He is alert.  Comments: MENTAL STATUS EXAM:    Orientation: Alert and oriented to person, place and time.  Memory: Cooperative, follows commands well.  Language: Speech is clear and language is normal.   CRANIAL NERVES:    CN 2 (Optic): Visual fields intact to confrontation.  CN 3,4,6 (EOM): Pupils equal and reactive to light. Full extraocular eye movement without nystagmus.  CN 5 (Trigeminal): Facial sensation is normal, no weakness of masticatory muscles.  CN 7 (Facial): No facial weakness or asymmetry.  CN 8 (Auditory): Auditory acuity grossly normal.  CN 9,10 (Glossophar): The uvula is midline, the palate elevates symmetrically.  CN 11 (spinal access): Normal sternocleidomastoid and trapezius strength.  CN 12 (Hypoglossal): The tongue is midline. No  atrophy or fasciculations.Marland Kitchen   MOTOR:  Muscle Strength: 5/5RUE, 5/5LUE, 5/5RLE, 5/5LLE.   COORDINATION:   No tremor.   SENSATION:   Intact to light touch all four extremities.  GAIT: Gait normal without ataxia   Psychiatric:        Mood and Affect: Mood normal.     ED Results / Procedures / Treatments   Labs (all labs ordered are listed, but only abnormal results are displayed) Labs Reviewed - No data to display  EKG None  Radiology CT Head Wo Contrast Result Date: 03/29/2023 CLINICAL DATA:  Chronic neck pain.  Fall in January. EXAM: CT HEAD WITHOUT CONTRAST CT CERVICAL SPINE WITHOUT CONTRAST TECHNIQUE: Multidetector CT imaging of the head and cervical spine was performed following the standard protocol without intravenous contrast. Multiplanar CT image reconstructions of the cervical spine were also generated. RADIATION DOSE REDUCTION: This exam was performed according to the departmental dose-optimization program which includes automated exposure control, adjustment of the mA and/or kV according to patient size and/or use of iterative reconstruction technique. COMPARISON:  None Available. FINDINGS: CT HEAD FINDINGS Brain: No mass,hemorrhage or extra-axial collection. Normal appearance of the parenchyma and CSF spaces. Vascular: No hyperdense vessel or unexpected vascular calcification. Skull: The visualized skull base, calvarium and extracranial soft tissues are normal. Sinuses/Orbits: No fluid levels or advanced mucosal thickening of the visualized paranasal sinuses. No mastoid or middle ear effusion. Normal orbits. Other: None. CT CERVICAL SPINE FINDINGS Alignment: No static subluxation. Facets are aligned. Occipital condyles are normally positioned. Skull base and vertebrae: No acute fracture. Soft tissues and spinal canal: No prevertebral fluid or swelling. No visible canal hematoma. Disc levels: Severe right C6 foraminal stenosis. Upper chest: No pneumothorax, pulmonary nodule or  pleural effusion. Other: Normal visualized paraspinal cervical soft tissues. IMPRESSION: 1. No acute intracranial abnormality. 2. No acute fracture or static subluxation of the cervical spine. 3. Severe right C6 foraminal stenosis. Electronically Signed   By: Deatra Robinson M.D.   On: 03/29/2023 03:24   CT Cervical Spine Wo Contrast Result Date: 03/29/2023 CLINICAL DATA:  Chronic neck pain.  Fall in January. EXAM: CT HEAD WITHOUT CONTRAST CT CERVICAL SPINE WITHOUT CONTRAST TECHNIQUE: Multidetector CT imaging of the head and cervical spine was performed following the standard protocol without intravenous contrast. Multiplanar CT image reconstructions of the cervical spine were also generated. RADIATION DOSE REDUCTION: This exam was performed according to the departmental dose-optimization program which includes automated exposure control, adjustment of the mA and/or kV according to patient size and/or use of iterative reconstruction technique. COMPARISON:  None Available. FINDINGS: CT HEAD FINDINGS Brain: No mass,hemorrhage or extra-axial collection. Normal appearance of the parenchyma and CSF spaces. Vascular: No hyperdense vessel or unexpected vascular calcification. Skull: The visualized skull base, calvarium and extracranial soft  tissues are normal. Sinuses/Orbits: No fluid levels or advanced mucosal thickening of the visualized paranasal sinuses. No mastoid or middle ear effusion. Normal orbits. Other: None. CT CERVICAL SPINE FINDINGS Alignment: No static subluxation. Facets are aligned. Occipital condyles are normally positioned. Skull base and vertebrae: No acute fracture. Soft tissues and spinal canal: No prevertebral fluid or swelling. No visible canal hematoma. Disc levels: Severe right C6 foraminal stenosis. Upper chest: No pneumothorax, pulmonary nodule or pleural effusion. Other: Normal visualized paraspinal cervical soft tissues. IMPRESSION: 1. No acute intracranial abnormality. 2. No acute fracture  or static subluxation of the cervical spine. 3. Severe right C6 foraminal stenosis. Electronically Signed   By: Deatra Robinson M.D.   On: 03/29/2023 03:24    Procedures Procedures    Medications Ordered in ED Medications  acetaminophen (TYLENOL) tablet 1,000 mg (1,000 mg Oral Given 03/29/23 0255)    ED Course/ Medical Decision Making/ A&P                                 Medical Decision Making Amount and/or Complexity of Data Reviewed Radiology: ordered.  Risk OTC drugs.     53 year old male presenting to the emergency department with chronic headache and neck pain.  The patient states that he sustained a fall back in January.  He was seen in the emergency department on 01/30/2023 and hit his head on a metal cart.  He had CT imaging at the time that was reportedly negative.  Since the fall, he has had chronic daily headaches.  He also has had some neck pain in his upper cervical spine.  He states that he has had no facial droop, numbness or weakness, no weakness or numbness in the extremities other than occasional bilateral numbness down the arms.  He followed up outpatient with a neurosurgeon at [facility] and on review of his CT there had been some concern about his cervical spine but he was unable to get imaging outpatient.  He presents to the emergency department today requesting CT imaging of the head and cervical spine.  On arrival, the patient was vitally stable.  He is neurologically intact without weakness or numbness.  He is presenting with postconcussive symptoms.  He is requesting imaging.  Tylenol was provided for headache.  CT Head and Cervical Spine: FINDINGS:  CT HEAD FINDINGS    Brain: No mass,hemorrhage or extra-axial collection. Normal  appearance of the parenchyma and CSF spaces.    Vascular: No hyperdense vessel or unexpected vascular calcification.    Skull: The visualized skull base, calvarium and extracranial soft  tissues are normal.    Sinuses/Orbits: No  fluid levels or advanced mucosal thickening of  the visualized paranasal sinuses. No mastoid or middle ear effusion.  Normal orbits.    Other: None.    CT CERVICAL SPINE FINDINGS    Alignment: No static subluxation. Facets are aligned. Occipital  condyles are normally positioned.    Skull base and vertebrae: No acute fracture.    Soft tissues and spinal canal: No prevertebral fluid or swelling. No  visible canal hematoma.    Disc levels: Severe right C6 foraminal stenosis.    Upper chest: No pneumothorax, pulmonary nodule or pleural effusion.    Other: Normal visualized paraspinal cervical soft tissues.    IMPRESSION:  1. No acute intracranial abnormality.  2. No acute fracture or static subluxation of the cervical spine.  3. Severe right C6 foraminal stenosis.  Patient without evidence of trauma to the cervical spine or intracranially.  Recommended that he follow-up with his PCP regarding his postconcussive symptoms and follow-up with his neurosurgeon regarding his severe right for medial stenosis.  He is currently without weakness with an intact neuroexam.  Advised continued symptomatic management outpatient and outpatient follow-up, stable for discharge.   Final Clinical Impression(s) / ED Diagnoses Final diagnoses:  Chronic nonintractable headache, unspecified headache type  Foraminal stenosis of cervical region    Rx / DC Orders ED Discharge Orders     None         Ernie Avena, MD 03/29/23 [phone]

## 2023-06-04 ENCOUNTER — Ambulatory Visit (HOSPITAL_COMMUNITY)
Admission: EM | Admit: 2023-06-04 | Discharge: 2023-06-05 | Disposition: A | Discharge status: Other Acute Inpatient Hospital | Attending: Psychiatry | Admitting: Psychiatry

## 2023-06-04 ENCOUNTER — Other Ambulatory Visit: Payer: Self-pay

## 2023-06-04 DIAGNOSIS — R45850 Homicidal ideations: Secondary | ICD-10-CM | POA: Diagnosis not present

## 2023-06-04 DIAGNOSIS — Z046 Encounter for general psychiatric examination, requested by authority: Secondary | ICD-10-CM

## 2023-06-04 DIAGNOSIS — F4310 Post-traumatic stress disorder, unspecified: Secondary | ICD-10-CM | POA: Insufficient documentation

## 2023-06-04 DIAGNOSIS — Z79891 Long term (current) use of opiate analgesic: Secondary | ICD-10-CM | POA: Insufficient documentation

## 2023-06-04 DIAGNOSIS — G8929 Other chronic pain: Secondary | ICD-10-CM | POA: Insufficient documentation

## 2023-06-04 DIAGNOSIS — I10 Essential (primary) hypertension: Secondary | ICD-10-CM | POA: Insufficient documentation

## 2023-06-04 DIAGNOSIS — F339 Major depressive disorder, recurrent, unspecified: Secondary | ICD-10-CM | POA: Diagnosis not present

## 2023-06-04 DIAGNOSIS — M542 Cervicalgia: Secondary | ICD-10-CM | POA: Insufficient documentation

## 2023-06-04 DIAGNOSIS — F909 Attention-deficit hyperactivity disorder, unspecified type: Secondary | ICD-10-CM | POA: Insufficient documentation

## 2023-06-04 DIAGNOSIS — Z79899 Other long term (current) drug therapy: Secondary | ICD-10-CM | POA: Insufficient documentation

## 2023-06-04 DIAGNOSIS — Z560 Unemployment, unspecified: Secondary | ICD-10-CM | POA: Insufficient documentation

## 2023-06-04 DIAGNOSIS — M549 Dorsalgia, unspecified: Secondary | ICD-10-CM | POA: Insufficient documentation

## 2023-06-04 LAB — POCT URINE DRUG SCREEN - MANUAL ENTRY (I-SCREEN)
POC Amphetamine UR: NOT DETECTED
POC Buprenorphine (BUP): NOT DETECTED
POC Cocaine UR: NOT DETECTED
POC Marijuana UR: NOT DETECTED
POC Methadone UR: NOT DETECTED
POC Methamphetamine UR: NOT DETECTED
POC Morphine: NOT DETECTED
POC Oxazepam (BZO): POSITIVE — AB
POC Oxycodone UR: POSITIVE — AB
POC Secobarbital (BAR): NOT DETECTED

## 2023-06-04 MED ORDER — PREGABALIN 75 MG PO CAPS
150.0000 mg | ORAL_CAPSULE | Freq: Two times a day (BID) | ORAL | Status: DC
  Administered 2023-06-04 – 2023-06-05 (×2): 150 mg via ORAL
  Filled 2023-06-04 (×2): qty 2

## 2023-06-04 MED ORDER — LORAZEPAM 2 MG/ML IJ SOLN: 2.0000 mg | Freq: Three times a day (TID) | INTRAMUSCULAR | Status: DC | PRN

## 2023-06-04 MED ORDER — HALOPERIDOL 5 MG PO TABS: 5.0000 mg | ORAL_TABLET | Freq: Three times a day (TID) | ORAL | Status: DC | PRN

## 2023-06-04 MED ORDER — LISINOPRIL 20 MG PO TABS: 20.0000 mg | ORAL_TABLET | Freq: Every day | ORAL | Status: DC

## 2023-06-04 MED ORDER — OXYCODONE HCL 5 MG PO TABS
20.0000 mg | ORAL_TABLET | ORAL | Status: DC | PRN
  Administered 2023-06-04 – 2023-06-05 (×2): 20 mg via ORAL
  Filled 2023-06-04 (×2): qty 4

## 2023-06-04 MED ORDER — HALOPERIDOL LACTATE 5 MG/ML IJ SOLN: 5.0000 mg | Freq: Three times a day (TID) | INTRAMUSCULAR | Status: DC | PRN

## 2023-06-04 MED ORDER — VENLAFAXINE HCL ER 150 MG PO CP24
150.0000 mg | ORAL_CAPSULE | Freq: Every day | ORAL | Status: DC
  Filled 2023-06-04: qty 1

## 2023-06-04 MED ORDER — LISINOPRIL-HYDROCHLOROTHIAZIDE 20-25 MG PO TABS: 1.0000 | ORAL_TABLET | Freq: Every morning | ORAL | Status: DC

## 2023-06-04 MED ORDER — DIPHENHYDRAMINE HCL 50 MG PO CAPS: 50.0000 mg | ORAL_CAPSULE | Freq: Three times a day (TID) | ORAL | Status: DC | PRN

## 2023-06-04 MED ORDER — DIPHENHYDRAMINE HCL 50 MG/ML IJ SOLN: 50.0000 mg | Freq: Three times a day (TID) | INTRAMUSCULAR | Status: DC | PRN

## 2023-06-04 MED ORDER — MAGNESIUM HYDROXIDE 400 MG/5ML PO SUSP: 30.0000 mL | Freq: Every day | ORAL | Status: DC | PRN

## 2023-06-04 MED ORDER — HALOPERIDOL LACTATE 5 MG/ML IJ SOLN: 10.0000 mg | Freq: Three times a day (TID) | INTRAMUSCULAR | Status: DC | PRN

## 2023-06-04 MED ORDER — HYDROCHLOROTHIAZIDE 25 MG PO TABS
25.0000 mg | ORAL_TABLET | Freq: Every day | ORAL | Status: DC
  Administered 2023-06-05: 25 mg via ORAL
  Filled 2023-06-04: qty 1

## 2023-06-04 MED ORDER — AMPHETAMINE-DEXTROAMPHETAMINE 10 MG PO TABS
20.0000 mg | ORAL_TABLET | Freq: Three times a day (TID) | ORAL | Status: DC
  Administered 2023-06-05: 20 mg via ORAL
  Filled 2023-06-04: qty 2

## 2023-06-04 MED ORDER — HYDROXYZINE HCL 25 MG PO TABS: 25.0000 mg | ORAL_TABLET | Freq: Three times a day (TID) | ORAL | Status: DC | PRN

## 2023-06-04 MED ORDER — ACETAMINOPHEN 325 MG PO TABS: 650.0000 mg | ORAL_TABLET | Freq: Four times a day (QID) | ORAL | Status: DC | PRN

## 2023-06-04 MED ORDER — TRAZODONE HCL 50 MG PO TABS: 50.0000 mg | ORAL_TABLET | Freq: Every evening | ORAL | Status: DC | PRN

## 2023-06-04 MED ORDER — ALUM & MAG HYDROXIDE-SIMETH 200-200-20 MG/5ML PO SUSP: 30.0000 mL | ORAL | Status: DC | PRN

## 2023-06-04 NOTE — ED Notes (Signed)
 Patient is drinking water has had about 16oz at this time

## 2023-06-04 NOTE — ED Notes (Signed)
 Pt given several cups of water reports he has not been drinking enough through the day,

## 2023-06-04 NOTE — ED Notes (Signed)
 Per PD, pt's mom called 911 d/t pt's dad calling and telling her that pt stated that he wanted to cut her up w/ a machete. Per PD, pt is denying and has been calm and cooperative w/ them. PD reports familial dispute going on currently over grandmother passing away. PD reports pt's mom is taking out and emergent 50b at this time.

## 2023-06-04 NOTE — ED Notes (Signed)
Patient A&Ox4. Denies SI/HI and A/VH. Patient denies any physical complaints when asked. No acute distress noted. Support and encouragement provided. Routine safety checks conducted according to facility protocol. Encouraged patient to notify staff if thoughts of harm toward self or others arise. Patient verbalize understanding and agreement. Will continue to monitor for safety.    

## 2023-06-04 NOTE — BH Assessment (Signed)
 Comprehensive Clinical Assessment (CCA) Note   06/04/2023 [name] [name] [id number]  Disposition: [facility], NP recommends continuous observation. Pt will be seen by Psychiatry in AM.   The patient demonstrates the following risk factors for suicide: Chronic risk factors for suicide include: psychiatric disorder of PTSD. Acute risk factors for suicide include: family or marital conflict. Protective factors for this patient include: positive therapeutic relationship. Considering these factors, the overall suicide risk at this point appears to be low. Patient is not appropriate for outpatient follow up.    Per triage Assessment: "[name] [name] presents to [facility] voluntarily via [employer] awaiting IVC paper work. Pt states that his mother called the police stating that he was going to hurt her with a machete. Pt states that all this started because his mother took the leash and claims that she didn't. Pt shares that he is prescribed Oxycodone  that he is allowed to have 4 a day. Pt currently denies SI, HI, AVH and alcohol/drug use. Pt states that he is unsure as to how we can assist him at this time."   Upon evaluation with this clinician, the patient is alert, oriented x 3, and cooperative. Speech is clear, coherent and logical. Pt appears casual. Eye contact is fair. Mood is anxious and depressed; affect is congruent with mood. The thought process is logical and thought content is coherent. Pt denies SI/HI/AVH. There is no indication that the patient is responding to internal stimuli. No delusions elicited during this assessment.     Per Parents, pt has become more agitated and upset recently which they believe it may be his medications. Per mom, pt threaten to kill her and often believes she is stealing from him. Mom does not feel safe if pt returns home. Mom is comfortable with pt's return once he has received some help.    Chief Complaint:  Chief Complaint  Patient presents with   Evaluation    Visit Diagnosis:  PTSD    CCA Screening, Triage and Referral (STR)  Patient Reported Information How did you hear about us ? Legal System  What Is the Reason for Your Visit/Call Today? [name] [name] presents to [facility] voluntarily via [employer] awaiting IVC paper work. Pt states that his mother called the police stating that he was going to hurt her with a machete. Pt states that all this started because his mother took the leash and claims that she didn't. Pt shares that he is prescribed Oxycodone  that he is allowed to have 4 a day. Pt currently denies SI, HI, AVH and alcohol/drug use. Pt states that he is unsure as to how we can assist him at this time.  How Long Has This Been Causing You Problems? <Week  What Do You Feel Would Help You the Most Today? Social Support   Have You Recently Had Any Thoughts About Hurting Yourself? No  Are You Planning to Commit Suicide/Harm Yourself At This time? No   Flowsheet Row ED from 06/04/2023 in [facility] ED from 03/29/2023 in [facility] Emergency Department at [facility] ED from 02/09/2023 in [facility] Emergency Department at [facility]  C-SSRS RISK CATEGORY No Risk No Risk No Risk       Have you Recently Had Thoughts About Hurting Someone Marigene Shoulder? No  Are You Planning to Harm Someone at This Time? No  Explanation: Denies HI   Have You Used Any Alcohol or Drugs in the Past 24 Hours? No  How Long Ago Did You Use  Drugs or Alcohol? N/A What Did You Use and How Much? N/A  Do You Currently Have a Therapist/Psychiatrist? Yes  Name of Therapist/Psychiatrist: Name of Therapist/Psychiatrist: Pt reports active outpatient services at [facility]   Have You Been Recently Discharged From Any Office Practice or Programs? No  Explanation of Discharge From Practice/Program: N/A    CCA Screening Triage Referral Assessment Type of Contact: Face-to-Face  Telemedicine Service Delivery:   Is this  Initial or Reassessment?   Date Telepsych consult ordered in [facility]:    Time Telepsych consult ordered in [facility]:    Location of Assessment: [facility] Assessment Services  Provider Location: [facility] Assessment Services   Collateral Involvement: Pt's parents Ninette Basque and [place])   Does Patient Have a Automotive engineer Guardian? No  Legal Guardian Contact Information: n/a  Copy of Legal Guardianship Form: -- (n/a)  Legal Guardian Notified of Arrival: -- (n/a)  Legal Guardian Notified of Pending Discharge: -- (n/a)  If Minor and Not Living with Parent(s), Who has Custody? n/a  Is CPS involved or ever been involved? Never  Is APS involved or ever been involved? Never   Patient Determined To Be At Risk for Harm To Self or Others Based on Review of Patient Reported Information or Presenting Complaint? No  Method: No Plan  Availability of Means: No access or NA  Intent: Vague intent or NA  Notification Required: No need or identified person  Additional Information for Danger to Others Potential: -- (n/a)  Additional Comments for Danger to Others Potential: Pt denies HI. However, mom reports pt has threaten to kill her before.  Are There Guns or Other Weapons in Your Home? Yes  Types of Guns/Weapons: Machete  Are These Weapons Safely Secured?                            Yes  Who Could Verify You Are Able To Have These Secured: Pt reports that it put away  with a lock  Do You Have any Outstanding Charges, Pending Court Dates, Parole/Probation? Denies pending legal charges  Contacted To Inform of Risk of Harm To Self or Others: -- (n/a)    Does Patient Present under Involuntary Commitment? Yes    [place] of Residence: [place]   Patient Currently Receiving the Following Services: Individual Therapy; Medication Management   Determination of Need: Routine (7 days)   Options For Referral: Inpatient Hospitalization; Outpatient Therapy; Medication Management     CCA  Biopsychosocial Patient Reported Schizophrenia/Schizoaffective Diagnosis in Past: No   Strengths: Able to express his needs   Mental Health Symptoms Depression:  None   Duration of Depressive symptoms:    Mania:  None   Anxiety:   Irritability; Worrying; Restlessness   Psychosis:  None   Duration of Psychotic symptoms:    Trauma:  None   Obsessions:  None   Compulsions:  None   Inattention:  Does not seem to listen; Loses things   Hyperactivity/Impulsivity:  None   Oppositional/Defiant Behaviors:  None   Emotional Irregularity:  Mood lability   Other Mood/Personality Symptoms:  none    Mental Status Exam Appearance and self-care  Stature:  Average   Weight:  Normal  Clothing:  Age-appropriate   Grooming:  Normal   Cosmetic use:  None   Posture/gait:  Normal   Motor activity:  Restless   Sensorium  Attention:  Normal   Concentration:  Anxiety interferes   Orientation:  X5   Recall/memory:  Normal   Affect and Mood  Affect:  Anxious   Mood:  Anxious   Relating  Eye contact:  Normal   Facial expression:  Anxious   Attitude toward examiner:  Cooperative   Thought and Language  Speech flow: Flight of Ideas; Pressured   Thought content:  Appropriate to Mood and Circumstances   Preoccupation:  None   Hallucinations:  None   Organization:  Coherent   [employer] of Knowledge:  Good   Intelligence:  Average   Abstraction:  Normal   Judgement:  Impaired   Reality Testing:  Adequate   Insight:  Fair   Decision Making:  Impulsive   Social Functioning  Social Maturity:  Impulsive   Social Judgement:  Heedless   Stress  Stressors:  Family conflict   Coping Ability:  Overwhelmed; Exhausted   Skill Deficits:  Communication; Decision making; Interpersonal   Supports:  Family     Religion: Religion/Spirituality Are You A Religious Person?: No How Might This Affect Treatment?:  n/a  Leisure/Recreation: Leisure / Recreation Do You Have Hobbies?: No  Exercise/Diet: Exercise/Diet Do You Exercise?: No Have You Gained or Lost A Significant Amount of Weight in the Past Six Months?: No Do You Follow a Special Diet?: No Do You Have Any Trouble Sleeping?: Yes Explanation of Sleeping Difficulties: Pt reports having night terrors   CCA Employment/Education Employment/Work Situation: Employment / Work Situation Employment Situation: Unemployed Patient's Job has Been Impacted by Current Illness: No Has Patient ever Been in Equities trader?: No  Education: Education Is Patient Currently Attending School?: No Last Grade Completed: 12 Did You Product manager?: No Did You Have An Individualized Education Program (IIEP): No Did You Have Any Difficulty At [employer]?: No Patient's Education Has Been Impacted by Current Illness: No   CCA Family/Childhood History Family and Relationship History: Family history Marital status: Single Does patient have children?: No  Childhood History:  Childhood History By whom was/is the patient raised?: Both parents Did patient suffer any verbal/emotional/physical/sexual abuse as a child?: No Did patient suffer from severe childhood neglect?: No Has patient ever been sexually abused/assaulted/raped as an adolescent or adult?: No Was the patient ever a victim of a crime or a disaster?: No Witnessed domestic violence?: No Has patient been affected by domestic violence as an adult?: No       CCA Substance Use Alcohol/Drug Use: Alcohol / Drug Use Pain Medications: See MAR Prescriptions: See MAR Over the Counter: See MAR History of alcohol / drug use?: No history of alcohol / drug abuse Longest period of sobriety (when/how long): denies etoh/ drug use Negative Consequences of Use:  (n/a) Withdrawal Symptoms: None                         ASAM's:  Six Dimensions of Multidimensional Assessment  Dimension 1:  Acute  Intoxication and/or Withdrawal Potential:      Dimension 2:  Biomedical Conditions and Complications:      Dimension 3:  Emotional, Behavioral, or Cognitive Conditions and Complications:     Dimension 4:  Readiness to Change:     Dimension 5:  Relapse, Continued use, or Continued Problem Potential:     Dimension 6:  Recovery/Living Environment:     ASAM Severity Score:    ASAM Recommended Level of Treatment: ASAM Recommended Level of Treatment:  (n/a)   Substance use Disorder (SUD) Substance Use Disorder (SUD)  Checklist Symptoms of  Substance Use:  (n/a)  Recommendations for Services/Supports/Treatments: Recommendations for Services/Supports/Treatments Recommendations For Services/Supports/Treatments: Medication Management, Individual Therapy, Inpatient Hospitalization  Disposition Recommendation per psychiatric provider: We recommend inpatient psychiatric hospitalization after medical hospitalization. Patient has been involuntarily committed on 06/04/23.    

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
## 2023-06-05 ENCOUNTER — Inpatient Hospital Stay (HOSPITAL_COMMUNITY)
Admission: AD | Admit: 2023-06-05 | Discharge: 2023-06-13 | DRG: 897 | Disposition: A | Discharge status: Home / Self Care | Source: Intra-hospital | Attending: Psychiatry | Admitting: Psychiatry

## 2023-06-05 ENCOUNTER — Encounter (HOSPITAL_COMMUNITY): Payer: Self-pay | Admitting: Behavioral Health

## 2023-06-05 DIAGNOSIS — Z79899 Other long term (current) drug therapy: Secondary | ICD-10-CM

## 2023-06-05 DIAGNOSIS — Z8249 Family history of ischemic heart disease and other diseases of the circulatory system: Secondary | ICD-10-CM

## 2023-06-05 DIAGNOSIS — Z7982 Long term (current) use of aspirin: Secondary | ICD-10-CM | POA: Diagnosis not present

## 2023-06-05 DIAGNOSIS — F329 Major depressive disorder, single episode, unspecified: Principal | ICD-10-CM | POA: Diagnosis present

## 2023-06-05 DIAGNOSIS — Z736 Limitation of activities due to disability: Secondary | ICD-10-CM | POA: Diagnosis not present

## 2023-06-05 DIAGNOSIS — Z9181 History of falling: Secondary | ICD-10-CM

## 2023-06-05 DIAGNOSIS — Z9152 Personal history of nonsuicidal self-harm: Secondary | ICD-10-CM | POA: Diagnosis not present

## 2023-06-05 DIAGNOSIS — F39 Unspecified mood [affective] disorder: Secondary | ICD-10-CM | POA: Diagnosis present

## 2023-06-05 DIAGNOSIS — T50905A Adverse effect of unspecified drugs, medicaments and biological substances, initial encounter: Secondary | ICD-10-CM | POA: Diagnosis not present

## 2023-06-05 DIAGNOSIS — K0889 Other specified disorders of teeth and supporting structures: Secondary | ICD-10-CM | POA: Diagnosis present

## 2023-06-05 DIAGNOSIS — F22 Delusional disorders: Secondary | ICD-10-CM | POA: Diagnosis present

## 2023-06-05 DIAGNOSIS — Z79891 Long term (current) use of opiate analgesic: Secondary | ICD-10-CM

## 2023-06-05 DIAGNOSIS — I10 Essential (primary) hypertension: Secondary | ICD-10-CM | POA: Diagnosis present

## 2023-06-05 DIAGNOSIS — Z818 Family history of other mental and behavioral disorders: Secondary | ICD-10-CM

## 2023-06-05 DIAGNOSIS — F4310 Post-traumatic stress disorder, unspecified: Secondary | ICD-10-CM | POA: Diagnosis present

## 2023-06-05 DIAGNOSIS — F1994 Other psychoactive substance use, unspecified with psychoactive substance-induced mood disorder: Principal | ICD-10-CM | POA: Diagnosis present

## 2023-06-05 DIAGNOSIS — F411 Generalized anxiety disorder: Secondary | ICD-10-CM | POA: Diagnosis present

## 2023-06-05 DIAGNOSIS — M542 Cervicalgia: Secondary | ICD-10-CM | POA: Diagnosis present

## 2023-06-05 DIAGNOSIS — F909 Attention-deficit hyperactivity disorder, unspecified type: Secondary | ICD-10-CM | POA: Diagnosis present

## 2023-06-05 DIAGNOSIS — M549 Dorsalgia, unspecified: Secondary | ICD-10-CM | POA: Diagnosis present

## 2023-06-05 DIAGNOSIS — Z833 Family history of diabetes mellitus: Secondary | ICD-10-CM

## 2023-06-05 DIAGNOSIS — Z560 Unemployment, unspecified: Secondary | ICD-10-CM

## 2023-06-05 DIAGNOSIS — G8929 Other chronic pain: Secondary | ICD-10-CM | POA: Diagnosis present

## 2023-06-05 DIAGNOSIS — F3189 Other bipolar disorder: Secondary | ICD-10-CM | POA: Diagnosis not present

## 2023-06-05 LAB — COMPREHENSIVE METABOLIC PANEL WITH GFR
ALT: 12 U/L (ref 0–44)
AST: 16 U/L (ref 15–41)
Albumin: 3.5 g/dL (ref 3.5–5.0)
Alkaline Phosphatase: 47 U/L (ref 38–126)
Anion gap: 9 (ref 5–15)
BUN: 6 mg/dL (ref 6–20)
CO2: 28 mmol/L (ref 22–32)
Calcium: 9 mg/dL (ref 8.9–10.3)
Chloride: 103 mmol/L (ref 98–111)
Creatinine, Ser: 0.76 mg/dL (ref 0.61–1.24)
GFR, Estimated: >60 mL/min (ref >60)
Glucose, Bld: 100 mg/dL — ABNORMAL HIGH (ref 70–99)
Potassium: 3.6 mmol/L (ref 3.5–5.1)
Sodium: 140 mmol/L (ref 135–145)
Total Bilirubin: 0.9 mg/dL (ref 0.0–1.2)
Total Protein: 5.7 g/dL — ABNORMAL LOW (ref 6.5–8.1)

## 2023-06-05 LAB — CBC WITH DIFFERENTIAL/PLATELET
Abs Immature Granulocytes: 0.01 10*3/uL (ref 0.00–0.07)
Basophils Absolute: 0 10*3/uL (ref 0.0–0.1)
Basophils Relative: 0 %
Eosinophils Absolute: 0.2 10*3/uL (ref 0.0–0.5)
Eosinophils Relative: 3 %
HCT: 41.3 % (ref 39.0–52.0)
Hemoglobin: 13.9 g/dL (ref 13.0–17.0)
Immature Granulocytes: 0 %
Lymphocytes Relative: 38 %
Lymphs Abs: 2.6 10*3/uL (ref 0.7–4.0)
MCH: 29.1 pg (ref 26.0–34.0)
MCHC: 33.7 g/dL (ref 30.0–36.0)
MCV: 86.6 fL (ref 80.0–100.0)
Monocytes Absolute: 0.8 10*3/uL (ref 0.1–1.0)
Monocytes Relative: 11 %
Neutro Abs: 3.4 10*3/uL (ref 1.7–7.7)
Neutrophils Relative %: 48 %
Platelets: 241 10*3/uL (ref 150–400)
RBC: 4.77 MIL/uL (ref 4.22–5.81)
RDW: 12.9 % (ref 11.5–15.5)
WBC: 7 10*3/uL (ref 4.0–10.5)
nRBC: 0 % (ref 0.0–0.2)

## 2023-06-05 LAB — LIPID PANEL
Cholesterol: 173 mg/dL (ref 0–200)
HDL: 43 mg/dL (ref >40)
LDL Cholesterol: 116 mg/dL — ABNORMAL HIGH (ref 0–99)
Total CHOL/HDL Ratio: 4 ratio
Triglycerides: 69 mg/dL (ref <150)
VLDL: 14 mg/dL (ref 0–40)

## 2023-06-05 LAB — HEMOGLOBIN A1C
Hgb A1c MFr Bld: 5.2 % (ref 4.8–5.6)
Mean Plasma Glucose: 102.54 mg/dL

## 2023-06-05 LAB — TSH: TSH: 2.375 u[IU]/mL (ref 0.350–4.500)

## 2023-06-05 LAB — ETHANOL: Alcohol, Ethyl (B): <15 mg/dL (ref <15)

## 2023-06-05 MED ORDER — LOPERAMIDE HCL 2 MG PO CAPS: 2.0000 mg | ORAL_CAPSULE | ORAL | Status: DC | PRN

## 2023-06-05 MED ORDER — HALOPERIDOL LACTATE 5 MG/ML IJ SOLN: 5.0000 mg | Freq: Three times a day (TID) | INTRAMUSCULAR | Status: DC | PRN

## 2023-06-05 MED ORDER — LISINOPRIL 20 MG PO TABS
20.0000 mg | ORAL_TABLET | Freq: Every day | ORAL | Status: DC
  Administered 2023-06-06 – 2023-06-13 (×8): 20 mg via ORAL
  Filled 2023-06-05 (×11): qty 1

## 2023-06-05 MED ORDER — ALUM & MAG HYDROXIDE-SIMETH 200-200-20 MG/5ML PO SUSP: 30.0000 mL | ORAL | Status: DC | PRN

## 2023-06-05 MED ORDER — DIPHENHYDRAMINE HCL 50 MG/ML IJ SOLN: 50.0000 mg | Freq: Three times a day (TID) | INTRAMUSCULAR | Status: DC | PRN

## 2023-06-05 MED ORDER — OXYCODONE HCL 5 MG PO TABS
20.0000 mg | ORAL_TABLET | ORAL | Status: DC | PRN
  Administered 2023-06-05 – 2023-06-06 (×3): 20 mg via ORAL
  Filled 2023-06-05 (×3): qty 4

## 2023-06-05 MED ORDER — DIPHENHYDRAMINE HCL 25 MG PO CAPS: 50.0000 mg | ORAL_CAPSULE | Freq: Three times a day (TID) | ORAL | Status: DC | PRN

## 2023-06-05 MED ORDER — LISINOPRIL 20 MG PO TABS
20.0000 mg | ORAL_TABLET | Freq: Every day | ORAL | Status: DC
  Administered 2023-06-05: 20 mg via ORAL
  Filled 2023-06-05: qty 1

## 2023-06-05 MED ORDER — ADULT MULTIVITAMIN W/MINERALS CH
1.0000 | ORAL_TABLET | Freq: Every day | ORAL | Status: DC
  Administered 2023-06-05: 1 via ORAL
  Filled 2023-06-05: qty 1

## 2023-06-05 MED ORDER — MAGNESIUM HYDROXIDE 400 MG/5ML PO SUSP: 30.0000 mL | Freq: Every day | ORAL | Status: DC | PRN

## 2023-06-05 MED ORDER — LORAZEPAM 2 MG/ML IJ SOLN: 2.0000 mg | Freq: Three times a day (TID) | INTRAMUSCULAR | Status: DC | PRN

## 2023-06-05 MED ORDER — VENLAFAXINE HCL ER 150 MG PO CP24
150.0000 mg | ORAL_CAPSULE | Freq: Every day | ORAL | Status: DC
  Filled 2023-06-05 (×3): qty 1

## 2023-06-05 MED ORDER — PREGABALIN 75 MG PO CAPS
150.0000 mg | ORAL_CAPSULE | Freq: Two times a day (BID) | ORAL | Status: DC
Start: 2023-06-05 — End: 2023-06-13
  Administered 2023-06-05 – 2023-06-13 (×16): 150 mg via ORAL
  Filled 2023-06-05 (×16): qty 2

## 2023-06-05 MED ORDER — LOPERAMIDE HCL 2 MG PO CAPS: 2.0000 mg | ORAL_CAPSULE | ORAL | Status: AC | PRN

## 2023-06-05 MED ORDER — ACETAMINOPHEN 325 MG PO TABS
650.0000 mg | ORAL_TABLET | Freq: Four times a day (QID) | ORAL | Status: DC | PRN
  Administered 2023-06-11 – 2023-06-13 (×6): 650 mg via ORAL
  Filled 2023-06-05 (×7): qty 2

## 2023-06-05 MED ORDER — HALOPERIDOL LACTATE 5 MG/ML IJ SOLN: 10.0000 mg | Freq: Three times a day (TID) | INTRAMUSCULAR | Status: DC | PRN

## 2023-06-05 MED ORDER — ONDANSETRON 4 MG PO TBDP: 4.0000 mg | ORAL_TABLET | Freq: Four times a day (QID) | ORAL | Status: DC | PRN

## 2023-06-05 MED ORDER — AMPHETAMINE-DEXTROAMPHETAMINE 10 MG PO TABS
20.0000 mg | ORAL_TABLET | Freq: Three times a day (TID) | ORAL | Status: DC
  Administered 2023-06-05 – 2023-06-06 (×3): 20 mg via ORAL
  Filled 2023-06-05 (×3): qty 2

## 2023-06-05 MED ORDER — LORAZEPAM 1 MG PO TABS: 1.0000 mg | ORAL_TABLET | Freq: Four times a day (QID) | ORAL | Status: DC | PRN

## 2023-06-05 MED ORDER — TRAZODONE HCL 50 MG PO TABS
50.0000 mg | ORAL_TABLET | Freq: Every evening | ORAL | Status: DC | PRN
  Filled 2023-06-05: qty 1

## 2023-06-05 MED ORDER — HYDROCHLOROTHIAZIDE 25 MG PO TABS
25.0000 mg | ORAL_TABLET | Freq: Every day | ORAL | Status: DC
Start: 2023-06-06 — End: 2023-06-13
  Administered 2023-06-06 – 2023-06-13 (×8): 25 mg via ORAL
  Filled 2023-06-05 (×9): qty 1

## 2023-06-05 MED ORDER — HALOPERIDOL 5 MG PO TABS: 5.0000 mg | ORAL_TABLET | Freq: Three times a day (TID) | ORAL | Status: DC | PRN

## 2023-06-05 MED ORDER — HYDROXYZINE HCL 25 MG PO TABS
25.0000 mg | ORAL_TABLET | Freq: Three times a day (TID) | ORAL | Status: DC | PRN
  Filled 2023-06-05: qty 1

## 2023-06-05 MED ORDER — THIAMINE MONONITRATE 100 MG PO TABS: 100.0000 mg | ORAL_TABLET | Freq: Every day | ORAL | Status: DC

## 2023-06-05 NOTE — Plan of Care (Signed)
  Problem: Activity: Goal: Interest or engagement in activities will improve Outcome: Progressing   Problem: Coping: Goal: Ability to verbalize frustrations and anger appropriately will improve Outcome: Progressing   Problem: Physical Regulation: Goal: Ability to maintain clinical measurements within normal limits will improve Outcome: Progressing   

## 2023-06-05 NOTE — ED Notes (Signed)
 Called X 2 to give report but there was no answer. Will try again.

## 2023-06-05 NOTE — ED Notes (Signed)
 Patient resting quietly in bed with eyes closed. Respirations equal and unlabored, skin warm and dry, NAD. Routine safety checks conducted according to facility protocol. Will continue to monitor for safety.
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
## 2023-06-05 NOTE — ED Notes (Addendum)
 Patient A&Ox4. Has been calm, cooperative, and appropriate with peers. He denies intent to harm self/others. Denies A/VH. He continues to blame his mother. States "I'm disposable to her." Patient endorses chronic back pain 9/10 (See MAR). No acute distress observed. Routine safety checks conducted according to facility protocol. Patient agreed to notify staff should thoughts of harm toward self or others arise. We will continue to monitor for safety.

## 2023-06-05 NOTE — ED Notes (Signed)
 Report given to Jeoffrey Mole, RN.  [employer] called for transport.

## 2023-06-05 NOTE — Discharge Instructions (Addendum)
Transfer to [facility] 

## 2023-06-05 NOTE — Progress Notes (Signed)
 [facility] LCSW Progress Note   06/05/2023    1:46 PM  [name] [name]   [id number]   Type of Contact and Topic:  Psychiatric Bed Placement   Pt accepted to [facility] 400-2    Patient meets inpatient criteria per Nickola Baron, NP   The attending provider will be Alver Jobs, MD  Call report to [phone]    Keri Peat, RN @ [facility] notified.     Pt scheduled  to arrive at [facility] TODAY.

## 2023-06-05 NOTE — ED Notes (Signed)
Patient observed resting quietly, eyes closed. Respirations equal and unlabored. Will continue to monitor for safety.  

## 2023-06-05 NOTE — ED Notes (Signed)
 Patient discharged to [facility]. Transported by [employer]

## 2023-06-05 NOTE — Group Note (Signed)
 Date:  06/05/2023 Time:  9:32 PM  Group Topic/Focus:  Wrap-Up Group:   The focus of this group is to help patients review their daily goal of treatment and discuss progress on daily workbooks.    Participation Level:  Active  Participation Quality:  Appropriate  Affect:  Appropriate  Cognitive:  Appropriate  Insight: Good  Engagement in Group:  Engaged  Modes of Intervention:  Discussion Additional Comments:  Patient stated that his day was a 4, he stated that he just wants to be heard and nobody listens to him.  [name] [name] 06/05/2023, 9:32 PM
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
## 2023-06-06 ENCOUNTER — Encounter (HOSPITAL_COMMUNITY): Payer: Self-pay

## 2023-06-06 DIAGNOSIS — T50905A Adverse effect of unspecified drugs, medicaments and biological substances, initial encounter: Secondary | ICD-10-CM | POA: Diagnosis not present

## 2023-06-06 DIAGNOSIS — F3189 Other bipolar disorder: Secondary | ICD-10-CM

## 2023-06-06 MED ORDER — OXYCODONE HCL 5 MG PO TABS: 20.0000 mg | ORAL_TABLET | Freq: Two times a day (BID) | ORAL | Status: DC | PRN

## 2023-06-06 MED ORDER — OXYCODONE HCL 5 MG PO TABS
10.0000 mg | ORAL_TABLET | Freq: Four times a day (QID) | ORAL | Status: DC | PRN
  Administered 2023-06-06 – 2023-06-09 (×8): 10 mg via ORAL
  Filled 2023-06-06 (×9): qty 2
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
## 2023-06-06 NOTE — Progress Notes (Signed)
   06/06/23 0000  Psych Admission Type (Psych Patients Only)  Admission Status Involuntary  Psychosocial Assessment  Patient Complaints None  Eye Contact Fair  Facial Expression Pained;Pensive  Affect Anxious;Depressed  Speech Logical/coherent  Interaction Assertive  Motor Activity Slow  Appearance/Hygiene Unremarkable  Behavior Characteristics Cooperative;Irritable  Mood Irritable  Thought Process  Coherency WDL  Content Blaming others  Delusions None reported or observed  Perception WDL  Hallucination None reported or observed  Judgment WDL  Confusion WDL  Danger to Self  Current suicidal ideation? Denies  Agreement Not to Harm Self Yes  Description of Agreement verbal  Danger to Others  Danger to Others None reported or observed

## 2023-06-06 NOTE — Group Note (Signed)
 Recreation Therapy Group Note   Group Topic:Stress Management  Group Date: 06/06/2023 Start Time: 0935 End Time: 1000 Facilitators: Quasean Frye-McCall, LRT,CTRS Location: [address] Dayroom   Group Topic: Stress Management   Goal Area(s) Addresses:  Patient will actively participate in stress management techniques presented during session.  Patient will successfully identify benefit of practicing stress management post d/c.   Behavioral Response: N/A  Intervention: Relaxation exercise with ambient sound and script   Activity: Guided Imagery. LRT provided education, instruction, and demonstration on practice of visualization via guided imagery. Patient was asked to participate in the technique introduced during session. LRT debriefed including topics of mindfulness, stress management and specific scenarios each patient could use these techniques. Patients were given suggestions of ways to access scripts post d/c and encouraged to explore Youtube and other apps available on smartphones, tablets, and computers.  Education:  Stress Management, Discharge Planning.   Education Outcome: Acknowledges education  Clinical Observations/Feedback: Patient actively engaged in technique introduced, expressed no concerns and demonstrated ability to practice skill independently post d/c.    Affect/Mood: N/A   Participation Level: Did not attend    Clinical Observations/Individualized Feedback:     Plan: Continue to engage patient in RT group sessions 2-3x/week.   Giulian Goldring-McCall, LRT,CTRS 06/06/2023 12:07 PM

## 2023-06-06 NOTE — Plan of Care (Signed)
   Problem: Education: Goal: Emotional status will improve Outcome: Progressing Goal: Mental status will improve Outcome: Progressing   Problem: Activity: Goal: Interest or engagement in activities will improve Outcome: Progressing Goal: Sleeping patterns will improve Outcome: Progressing   Problem: Safety: Goal: Periods of time without injury will increase Outcome: Progressing

## 2023-06-06 NOTE — Progress Notes (Signed)
   06/06/23 1100  Psych Admission Type (Psych Patients Only)  Admission Status Involuntary  Psychosocial Assessment  Patient Complaints Other (Comment) (Pt was irritable and reported his pain meds need to be scheduled every 4 hours. He perseverated and belabored the point. He continued to disagree that his oxycodone  should be as needed and continued to argue.  Pt rested in bed. Denied SI/HI/AVH.)  Eye Contact Fair  Facial Expression Pained  Affect Irritable  Speech Argumentative  Interaction Assertive  Motor Activity Slow  Appearance/Hygiene Unremarkable  Behavior Characteristics Irritable  Mood Irritable  Thought Process  Coherency WDL  Content Blaming others  Delusions None reported or observed  Perception WDL  Hallucination None reported or observed  Judgment Impaired  Confusion None  Danger to Self  Current suicidal ideation? Denies  Agreement Not to Harm Self Yes  Description of Agreement Verbal  Danger to Others  Danger to Others None reported or observed

## 2023-06-06 NOTE — BH IP Treatment Plan (Signed)
 Interdisciplinary Treatment and Diagnostic Plan Update  06/06/2023 Time of Session: 10[name] MRN: [id number]  Principal Diagnosis: Drug-induced bipolar disorder [facility])  Secondary Diagnoses: Principal Problem:   Drug-induced bipolar disorder (HCC) Active Problems:   MDD (major depressive disorder)   Current Medications:  Current Facility-Administered Medications  Medication Dose Route Frequency Provider Last Rate Last Admin   acetaminophen  (TYLENOL ) tablet 650 mg  650 mg Oral Q6H PRN White, Patrice L, NP       alum & mag hydroxide-simeth (MAALOX/MYLANTA) 200-200-20 MG/5ML suspension 30 mL  30 mL Oral Q4H PRN White, Patrice L, NP       haloperidol  (HALDOL ) tablet 5 mg  5 mg Oral TID PRN White, Patrice L, NP       And   diphenhydrAMINE  (BENADRYL ) capsule 50 mg  50 mg Oral TID PRN White, Patrice L, NP       haloperidol  lactate (HALDOL ) injection 5 mg  5 mg Intramuscular TID PRN White, Patrice L, NP       And   diphenhydrAMINE  (BENADRYL ) injection 50 mg  50 mg Intramuscular TID PRN White, Patrice L, NP       And   LORazepam  (ATIVAN ) injection 2 mg  2 mg Intramuscular TID PRN White, Patrice L, NP       haloperidol  lactate (HALDOL ) injection 10 mg  10 mg Intramuscular TID PRN White, Patrice L, NP       And   diphenhydrAMINE  (BENADRYL ) injection 50 mg  50 mg Intramuscular TID PRN White, Patrice L, NP       And   LORazepam  (ATIVAN ) injection 2 mg  2 mg Intramuscular TID PRN White, Patrice L, NP       hydrochlorothiazide  (HYDRODIURIL ) tablet 25 mg  25 mg Oral Daily White, Patrice L, NP   25 mg at 06/06/23 4098   hydrOXYzine  (ATARAX ) tablet 25 mg  25 mg Oral TID PRN White, Patrice L, NP       lisinopril  (ZESTRIL ) tablet 20 mg  20 mg Oral Daily White, Patrice L, NP   20 mg at 06/06/23 1191   loperamide  (IMODIUM ) capsule 2-4 mg  2-4 mg Oral PRN White, Patrice L, NP       magnesium  hydroxide (MILK OF MAGNESIA) suspension 30 mL  30 mL Oral Daily PRN White, Patrice L, NP        oxyCODONE  (Oxy IR/ROXICODONE ) immediate release tablet 10 mg  10 mg Oral Q6H PRN Izediuno, Iline Mallory, MD       pregabalin  (LYRICA ) capsule 150 mg  150 mg Oral BID White, Patrice L, NP   150 mg at 06/06/23 4782   traZODone  (DESYREL ) tablet 50 mg  50 mg Oral QHS PRN White, Patrice L, NP       PTA Medications: Medications Prior to Admission  Medication Sig Dispense Refill Last Dose/Taking   amphetamine -dextroamphetamine  (ADDERALL) 20 MG tablet Take 20 mg by mouth 3 (three) times daily.      ascorbic acid (VITAMIN C) 500 MG tablet Take 500 mg by mouth in the morning and at bedtime.      aspirin EC 81 MG tablet Take 81 mg by mouth every morning.       Cholecalciferol 125 MCG (5000 UT) capsule Take 5,000 Units by mouth daily.      desvenlafaxine (PRISTIQ) 100 MG 24 hr tablet Take 100 mg by mouth daily.      diazepam  (VALIUM ) 5 MG tablet Take 5 mg by mouth 2 (two) times daily as  needed.      fluticasone (FLONASE) 50 MCG/ACT nasal spray Place 2 sprays into both nostrils daily.      HYDROcodone -acetaminophen  (NORCO/VICODIN) 5-325 MG tablet Take 1 tablet by mouth every 6 (six) hours as needed for severe pain (pain score 7-10) (as needed for breakthrough pain).      lisinopril -hydrochlorothiazide  (PRINZIDE ,ZESTORETIC ) 20-25 MG per tablet Take 1 tablet by mouth every morning.      Oxycodone  HCl 20 MG TABS Take 1 tablet (20 mg total) by mouth every 4 (four) hours as needed. 10 tablet 0    pregabalin  (LYRICA ) 150 MG capsule Take 150 mg by mouth 2 (two) times daily.      Vitamin D , Ergocalciferol , (DRISDOL ) 50000 UNITS CAPS capsule Take 1 capsule (50,000 Units total) by mouth every 7 (seven) days. 12 capsule 0     Patient Stressors: Marital or family conflict    Patient Strengths: Ability for insight   Treatment Modalities: Medication Management, Group therapy, Case management,  1 to 1 session with clinician, Psychoeducation, Recreational therapy.   Physician Treatment Plan for Primary Diagnosis:  Drug-induced bipolar disorder (HCC) Long Term Goal(s): Improvement in symptoms so as ready for discharge   Short Term Goals: Ability to identify changes in lifestyle to reduce recurrence of condition will improve Ability to verbalize feelings will improve Ability to demonstrate self-control will improve Ability to identify and develop effective coping behaviors will improve Ability to maintain clinical measurements within normal limits will improve Compliance with prescribed medications will improve Ability to identify triggers associated with substance abuse/mental health issues will improve  Medication Management: Evaluate patient's response, side effects, and tolerance of medication regimen.  Therapeutic Interventions: 1 to 1 sessions, Unit Group sessions and Medication administration.  Evaluation of Outcomes: Not Progressing  Physician Treatment Plan for Secondary Diagnosis: Principal Problem:   Drug-induced bipolar disorder (HCC) Active Problems:   MDD (major depressive disorder)  Long Term Goal(s): Improvement in symptoms so as ready for discharge   Short Term Goals: Ability to identify changes in lifestyle to reduce recurrence of condition will improve Ability to verbalize feelings will improve Ability to demonstrate self-control will improve Ability to identify and develop effective coping behaviors will improve Ability to maintain clinical measurements within normal limits will improve Compliance with prescribed medications will improve Ability to identify triggers associated with substance abuse/mental health issues will improve     Medication Management: Evaluate patient's response, side effects, and tolerance of medication regimen.  Therapeutic Interventions: 1 to 1 sessions, Unit Group sessions and Medication administration.  Evaluation of Outcomes: Not Progressing   RN Treatment Plan for Primary Diagnosis: Drug-induced bipolar disorder (HCC) Long Term Goal(s):  Knowledge of disease and therapeutic regimen to maintain health will improve  Short Term Goals: Ability to remain free from injury will improve, Ability to verbalize frustration and anger appropriately will improve, Ability to demonstrate self-control, Ability to participate in decision making will improve, Ability to verbalize feelings will improve, Ability to disclose and discuss suicidal ideas, Ability to identify and develop effective coping behaviors will improve, and Compliance with prescribed medications will improve  Medication Management: RN will administer medications as ordered by provider, will assess and evaluate patient's response and provide education to patient for prescribed medication. RN will report any adverse and/or side effects to prescribing provider.  Therapeutic Interventions: 1 on 1 counseling sessions, Psychoeducation, Medication administration, Evaluate responses to treatment, Monitor vital signs and CBGs as ordered, Perform/monitor CIWA, COWS, AIMS and Fall Risk screenings as ordered, Perform  wound care treatments as ordered.  Evaluation of Outcomes: Not Progressing   LCSW Treatment Plan for Primary Diagnosis: Drug-induced bipolar disorder (HCC) Long Term Goal(s): Safe transition to appropriate next level of care at discharge, Engage patient in therapeutic group addressing interpersonal concerns.  Short Term Goals: Engage patient in aftercare planning with referrals and resources, Increase social support, Increase ability to appropriately verbalize feelings, Increase emotional regulation, Facilitate acceptance of mental health diagnosis and concerns, Facilitate patient progression through stages of change regarding substance use diagnoses and concerns, Identify triggers associated with mental health/substance abuse issues, and Increase skills for wellness and recovery  Therapeutic Interventions: Assess for all discharge needs, 1 to 1 time with Social worker, Explore  available resources and support systems, Assess for adequacy in community support network, Educate family and significant other(s) on suicide prevention, Complete Psychosocial Assessment, Interpersonal group therapy.  Evaluation of Outcomes: Not Progressing   Progress in Treatment: Attending groups: No. Participating in groups: No. Taking medication as prescribed: Yes. Toleration medication: Yes. Family/Significant other contact made: No, will contact:  Maron Stanzione (Father) [phone] Patient understands diagnosis: Yes. Discussing patient identified problems/goals with staff: Yes. Medical problems stabilized or resolved: Yes. Denies suicidal/homicidal ideation: Yes. Issues/concerns per patient self-inventory: No. Other: n/a  New problem(s) identified: No, Describe:  None  New Short Term/Long Term Goal(s): detox, medication management for mood stabilization; elimination of SI thoughts; development of comprehensive mental wellness/sobriety plan  Patient Goals:  UTA - patient tangential, manic,   Discharge Plan or Barriers: Patient recently admitted. CSW will continue to follow and assess for appropriate referrals and possible discharge planning.    Reason for Continuation of Hospitalization: Anxiety Mania Medication stabilization Other; describe mood stabilization, discharge planning  Estimated Length of Stay: 3-5 DAYS  Last 3 [place] Suicide Severity Risk Score: Flowsheet Row Admission (Current) [facility] from 06/04/2023 in [facility] ED from 03/29/2023 in [facility] Emergency Department at [facility]  C-SSRS RISK CATEGORY No Risk No Risk No Risk       Last PHQ 2/9 Scores:    05/07/2013    4:27 PM  Depression screen PHQ 2/9  Decreased Interest 0  Down, Depressed, Hopeless 2  PHQ - 2 Score 2    Scribe for Treatment Team: Vernestine Gondola, LCSW 06/06/2023 5:07 PM
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
## 2023-06-06 NOTE — H&P (Addendum)
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 pain (pain score 7-10) (as needed for breakthrough pain).      lisinopril -hydrochlorothiazide  (PRINZIDE ,ZESTORETIC ) 20-25 MG per tablet Take 1 tablet by mouth every morning.      Oxycodone  HCl 20 MG TABS Take 1 tablet (20 mg total) by mouth every 4 (four) hours as needed. 10 tablet 0    pregabalin  (LYRICA ) 150 MG capsule Take 150 mg by mouth 2 (two) times daily.      Vitamin D , Ergocalciferol , (DRISDOL ) 50000 UNITS CAPS capsule Take 1 capsule (50,000 Units total) by mouth every 7 (seven) days. 12 capsule 0     Musculoskeletal: Strength & Muscle Tone: within normal limits Gait & Station: normal Patient leans: N/A  Psychiatric Specialty Exam:  Presentation  General Appearance: Appropriate for Environment  Eye Contact:Fair  Speech:Clear and Coherent  Speech Volume:Normal  Mood and  Affect  Mood: "I shouldn't be here"  Affect: Irritable, Congruent   Thought Process  Thought Processes: Tangential throughout interview   Descriptions of Associations:Intact  Orientation:Full (Time, Place and Person)  Thought Content: Has paranoia regarding mom  History of Schizophrenia/Schizoaffective disorder:No  Duration of Psychotic Symptoms: Years per family Hallucinations:Hallucinations: None  Ideas of Reference:None  Suicidal Thoughts:Suicidal Thoughts: No  Homicidal Thoughts:Homicidal Thoughts: No   Sensorium  Memory:Immediate Fair  Judgment:Poor  Insight:Poor   Executive Functions  Concentration:Fair  Attention Span:Fair  Recall:Fair  Fund of Knowledge:Fair  Language:Fair   Psychomotor Activity  Psychomotor Activity:Psychomotor Activity: Normal   Assets  Assets:Communication Skills; Desire for Improvement   Sleep  Sleep: 3.25 hours   Physical Exam ROS Physical Exam Constitutional:      Appearance: the patient is not toxic-appearing.  Pulmonary:     Effort: Pulmonary effort is normal.  Neurological:     General: No focal deficit present.     Mental Status: the patient is alert and oriented to person, place, and time.   Review of Systems  Respiratory:  Negative for shortness of breath.   Cardiovascular:  Negative for chest pain.  Gastrointestinal:  Negative for abdominal pain, constipation, diarrhea, nausea and vomiting.  Neurological:  Negative for headaches.   Blood pressure 100/64, pulse 67, temperature 97.9 F (36.6 C), temperature source Oral, resp. rate 16, height 6' (1.829 m), weight 85.1 kg, SpO2 98%. Body mass index is 25.44 kg/m.   Treatment Plan Summary: Daily contact with patient to assess and evaluate symptoms and progress in treatment, Medication management, and Plan    ASSESSMENT: [name] [name] is a 53 y.o. male  with a past psychiatric history of MDD, ADHD, PTSD. Patient initially arrived to [facility] on  5/18, and admitted to [facility] under IVC for acute safety concerns and crisis stabalization, feels that his mother is out to get him and has frequent mood swings. PPHx is significant for MDD, and no history of Suicide Attempts, Self Injurious Behavior, or Prior Psychiatric Hospitalizations. PMHx is significant for HTN.  Patient appears to currently be in a hypomanic state as evidenced by his flight of ideas and tangential speech on exam, distractability, grandiosity, activity increase, and lack of sleep, possibly in the context of ongoing amphetamine  usage. Will discontinue amphetamine . We recommended a mood stabilizer for patient however he has declined at this time. Patient is resistant to psychotropic medications. We will stop the effexor  that was restarted at time of admission. He seems to have been weaned off of oxycodone  so we will continue decreased dosing as noted in PDMP. Per collateral, appears that patient has had increased paranoia over the last couple of years that worsened this past week, raising concern for delusional disorder. There were also concerns for other's safety due to patient's behavior however patient has poor insight to the concerns by others of his behavior.   Diagnoses / Active Problems: Substance-induced bipolar 2 disorder, current episode hypomanic (r/o delusional disorder) History of ADHD History of PTSD   PLAN: Safety and Monitoring:  --  INVOLUNTARY  admission to inpatient psychiatric unit for safety, stabilization and treatment  -- Daily contact with patient to assess and evaluate symptoms and progress in treatment  -- Patient's case to be discussed in multi-disciplinary team meeting  -- Observation Level : q15 minute checks  -- Vital signs:  q12 hours  -- Precautions: suicide, elopement, and assault  2. Psychiatric Diagnoses and Treatment:  --recommended to patient starting abilify 5mg   for paranoia and additional mood stabilization, patient declined at this time.   --start hydroxyzine  25mg  TID PRN for anxiety --start trazodone  50mg  at bedtime PRN for insomnia  --stop effexor  XR 150mg  for depression given patient currently hypomanic  --stop home adderall for ADHD   -- The risks/benefits/side-effects/alternatives to this medication were discussed in detail with the patient and time was given for questions. The patient consents to medication trial.              -- Metabolic profile and EKG monitoring obtained while on an atypical antipsychotic BMI: Body mass index is 25.44 kg/m. TSH:  Lab Results  Component Value Date   TSH 2.375 06/05/2023   Lipid Panel:  Lab Results  Component Value Date   CHOL 173 06/05/2023   HDL 43 06/05/2023   LDLCALC 116 (H) 06/05/2023   TRIG 69 06/05/2023   CHOLHDL 4.0 06/05/2023   HbgA1c:  Lab Results  Component Value Date   HGBA1C 5.2 06/05/2023   QTc: 385 (5/17)             -- Encouraged patient to participate in unit milieu and in scheduled group therapies   -- Short Term Goals: Ability to identify changes in lifestyle to reduce recurrence of condition will improve, Ability to verbalize feelings will improve, Ability to demonstrate self-control will improve, Ability to identify and develop effective coping behaviors will improve, Ability to maintain clinical measurements within normal limits will improve, Compliance with prescribed medications will improve, and Ability to identify triggers associated with substance abuse/mental health issues will improve  -- Long Term Goals: Improvement in symptoms so as ready for discharge  Other PRNS:  acetaminophen , alum & mag hydroxide-simeth, haloperidol  **AND** diphenhydrAMINE , haloperidol  lactate **AND** diphenhydrAMINE  **AND** LORazepam , haloperidol  lactate **AND** diphenhydrAMINE  **AND** LORazepam , hydrOXYzine , loperamide , magnesium  hydroxide, oxyCODONE , traZODone    -- As needed agitation protocol in-place  3. Medical Issues Being Addressed:   #HTN --continue home  lisinopril -HCTZ  #Chronic pain Continue home oxy 20mg  BID, lyrica  150 BID   4. Discharge Planning:   -- Social work and case management to assist with discharge planning and identification of hospital follow-up needs prior to discharge  -- Estimated LOS: 5-7 days  -- Discharge Concerns: Need to establish a safety plan; Medication compliance and effectiveness  -- Discharge Goals: Return home with outpatient referrals for mental health follow-up including medication management/psychotherapy   I certify that inpatient services furnished can reasonably be expected to improve the patient's condition.   This note was created using a voice recognition software as a result there may be grammatical errors inadvertently enclosed that do not reflect the nature of this encounter. Every attempt is made to correct such errors.   This case was discussed with attending Dr. Jerrold Morgan who agrees with the above formulated treatment plan. Please see attending attestation for additional details.   This note was created using a voice recognition software as a result there may be grammatical errors inadvertently enclosed that do not reflect the nature of this encounter. Every attempt is made to correct such errors.   Signed: Dr. Norbert Bean, MD PGY-2, Psychiatry Residency  5/19/20258:24 AM

## 2023-06-06 NOTE — [facility] Group Notes (Signed)
 Adult Psychoeducational Group Note  Date:  06/06/2023 Time:  10:42 AM  Group Topic/Focus:  Goals Group:   The focus of this group is to help patients establish daily goals to achieve during treatment and discuss how the patient can incorporate goal setting into their daily lives to aide in recovery.  Participation Level:  Did Not Attend  Participation Quality:  na  Affect:  na  Cognitive:  na  Insight: na  Engagement in Group:  na  Modes of Intervention:  na  Additional Comments:  Pt did not attend group  Alean Amen 06/06/2023, 10:42 AM

## 2023-06-06 NOTE — Plan of Care (Signed)
   Problem: Education: Goal: Knowledge of [facility] Education information/materials will improve Outcome: Progressing Goal: Emotional status will improve Outcome: Progressing Goal: Mental status will improve Outcome: Progressing Goal: Verbalization of understanding the information provided will improve Outcome: Progressing   Problem: Safety: Goal: Periods of time without injury will increase Outcome: Progressing

## 2023-06-07 DIAGNOSIS — T50905A Adverse effect of unspecified drugs, medicaments and biological substances, initial encounter: Secondary | ICD-10-CM | POA: Diagnosis not present

## 2023-06-07 DIAGNOSIS — F3189 Other bipolar disorder: Secondary | ICD-10-CM | POA: Diagnosis not present

## 2023-06-07 MED ORDER — ARIPIPRAZOLE 5 MG PO TABS
5.0000 mg | ORAL_TABLET | Freq: Every day | ORAL | Status: DC
  Administered 2023-06-07 – 2023-06-08 (×2): 5 mg via ORAL
  Filled 2023-06-07 (×2): qty 1

## 2023-06-07 NOTE — [facility] Group Notes (Signed)

## 2023-06-07 NOTE — Group Note (Signed)
 Date:  06/07/2023 Time:  10:38 AM  Group Topic/Focus:  Goals Group:   The focus of this group is to help patients establish daily goals to achieve during treatment and discuss how the patient can incorporate goal setting into their daily lives to aide in recovery.    Participation Level:  Active  Participation Quality:  Attentive  Affect:  Appropriate  Cognitive:  Alert  Insight: Good  Engagement in Group:  Engaged  Modes of Intervention:  Education  Additional Comments:  He expressed his emotions and how life is difficult for him at him but he is learning new ways to navigate through his situations, so he can be successful in life and get along with family and friends.  Hughie Mae 06/07/2023, 10:38 AM

## 2023-06-07 NOTE — Progress Notes (Signed)
 [name] MD Progress Note  06/07/2023 6:55 AM [name]  MRN:  [id number]  Principal Problem: Drug-induced bipolar disorder [facility] Diagnosis: Principal Problem:   Drug-induced bipolar disorder (HCC) Active Problems:   MDD (major depressive disorder)  Reason for Admission:  [name] [name] is a 53 y.o. male  with a past psychiatric history of MDD, ADHD, PTSD. Patient initially arrived to [facility] on 5/18, and admitted to [facility] under IVC for acute safety concerns and crisis stabalization, feels that his mother is out to get him and has frequent mood swings. PPHx is significant for MDD, and no history of Suicide Attempts, Self Injurious Behavior, or Prior Psychiatric Hospitalizations. PMHx is significant for HTN.   (admitted on 06/05/2023, total  LOS: 2 days )  Chart review: Overnight Events Vital signs: stable  MAR was reviewed and patient was compliant with medications.  Patient received PRN oxycodone  Sleep: 7.25 hours Nursing notes /groups: patient irritable and blaming others  Pertinent information discussed during bed progression:  Case was discussed in the multidisciplinary team. Report patient denies SI/HI/AVH however is very circumstantial and blaming mother.   Information Obtained Today During Patient Interview: Patient evaluated on the unit. Reports no issues with sleep. Reports no issues with appetite. Reports some physical pain. He denies SI/HI/AVH. He continues to be paranoid regarding his mother and stating that she is narcassistic and thinking that others here are also out to get him. Continues to be very circumstantial and tangential during the conversation talking about how he wants to see his dogs at home and how his mom has issues and should be in the hospital. He initially is very resistant to any medications even when attempted to explain medication and side effects to medications. However, at the end of the interview he agrees to trying medications.    Past Psychiatric  History:  History of ADHD, MDD, GAD and PTSD. No past inpatient psychiatric hospitalizations.    Current Psychiatrist: [facility]  Current Therapist: none Previous Psychiatric Diagnoses: MDD, GAD, ADHD, PTSD  Current psychiatric medications: pristiq (not taking due to not helping), adderall Psychiatric medication history/compliance: effexor , gabapentin  300 TID, prozac, paxil, trintellix, vraylar (not taken 2/2 patient concern about side effects) Psychiatric Hospitalization hx: none Psychotherapy hx: denies Neuromodulation history: denies History of suicide (obtained from HPI): denies History of homicide or aggression (obtained in HPI): aggression towards mother prior to admission per IVC   Family Psychiatric History: Reports mother with depression   Social History:  Patient resides with his parents. He is unemployed and on disability.    Living Situation: was living with his parents  Education: reports graduated high school Occupational hx: on disability for back pain Marital Status: single  Children: reports 1 daughter born in 70, reports 1 grandchild  Legal: reports was in jail years ago for drinking and Ambulance person: denies   Access to firearms: denies though has access to [employer]  Medication stockpile: denies   Past Medical History:  Past Medical History:  Diagnosis Date   Back pain, chronic    Hypertension    Family History:  Family History  Problem Relation Age of Onset   Cancer Mother    Hypertension Mother    Hypertension Father    Diabetes Father    Diabetes Maternal Uncle     Current Medications: Current Facility-Administered Medications  Medication Dose Route Frequency Provider Last Rate Last Admin   acetaminophen  (TYLENOL ) tablet 650 mg  650 mg Oral Q6H PRN White, Robby Chime, NP  alum & mag hydroxide-simeth (MAALOX/MYLANTA) 200-200-20 MG/5ML suspension 30 mL  30 mL Oral Q4H PRN White, Patrice L, NP       haloperidol  (HALDOL ) tablet  5 mg  5 mg Oral TID PRN White, Patrice L, NP       And   diphenhydrAMINE  (BENADRYL ) capsule 50 mg  50 mg Oral TID PRN White, Patrice L, NP       haloperidol  lactate (HALDOL ) injection 5 mg  5 mg Intramuscular TID PRN White, Patrice L, NP       And   diphenhydrAMINE  (BENADRYL ) injection 50 mg  50 mg Intramuscular TID PRN White, Patrice L, NP       And   LORazepam  (ATIVAN ) injection 2 mg  2 mg Intramuscular TID PRN White, Patrice L, NP       haloperidol  lactate (HALDOL ) injection 10 mg  10 mg Intramuscular TID PRN White, Patrice L, NP       And   diphenhydrAMINE  (BENADRYL ) injection 50 mg  50 mg Intramuscular TID PRN White, Patrice L, NP       And   LORazepam  (ATIVAN ) injection 2 mg  2 mg Intramuscular TID PRN White, Patrice L, NP       hydrochlorothiazide  (HYDRODIURIL ) tablet 25 mg  25 mg Oral Daily White, Patrice L, NP   25 mg at 06/06/23 0981   hydrOXYzine  (ATARAX ) tablet 25 mg  25 mg Oral TID PRN White, Patrice L, NP       lisinopril  (ZESTRIL ) tablet 20 mg  20 mg Oral Daily White, Patrice L, NP   20 mg at 06/06/23 1914   loperamide  (IMODIUM ) capsule 2-4 mg  2-4 mg Oral PRN White, Patrice L, NP       magnesium  hydroxide (MILK OF MAGNESIA) suspension 30 mL  30 mL Oral Daily PRN White, Patrice L, NP       oxyCODONE  (Oxy IR/ROXICODONE ) immediate release tablet 10 mg  10 mg Oral Q6H PRN Izediuno, Iline Mallory, MD   10 mg at 06/06/23 1721   pregabalin  (LYRICA ) capsule 150 mg  150 mg Oral BID White, Patrice L, NP   150 mg at 06/06/23 1721   traZODone  (DESYREL ) tablet 50 mg  50 mg Oral QHS PRN White, Patrice L, NP        Lab Results: No results found for this or any previous visit (from the past 48 hours).  Blood Alcohol level:  Lab Results  Component Value Date   ETH <15 06/05/2023   ETH (H) 03/28/2010    24        LOWEST DETECTABLE LIMIT FOR SERUM ALCOHOL IS 5 mg/dL FOR MEDICAL PURPOSES ONLY    Metabolic Labs: Lab Results  Component Value Date   HGBA1C 5.2 06/05/2023   MPG 102.54  06/05/2023   No results found for: "PROLACTIN" Lab Results  Component Value Date   CHOL 173 06/05/2023   TRIG 69 06/05/2023   HDL 43 06/05/2023   CHOLHDL 4.0 06/05/2023   VLDL 14 06/05/2023   LDLCALC 116 (H) 06/05/2023   LDLCALC 96 05/07/2013    Sleep: 8 hours  Physical Findings: AIMS: No  CIWA:  CIWA-Ar Total: 3 COWS:     Psychiatric Specialty Exam:  Presentation  General Appearance: Appropriate for Environment  Eye Contact:Fair  Speech:Clear and Coherent  Speech Volume:Normal  Handedness:Right   Mood and Affect  Mood:Depressed  Affect:Congruent   Thought Process  Thought Processes: Circumstantial  Descriptions of Associations: Loose  Orientation:Full (Time, Place and Person)  Thought Content: Illogical  History of Schizophrenia/Schizoaffective disorder:No  Duration of Psychotic Symptoms: > 6 months Hallucinations: denies  Ideas of Reference: paranoid ideation  Suicidal Thoughts: Denies Homicidal Thoughts: Denies  Sensorium  Memory:Immediate Fair  Judgment:Poor  Insight:Poor   Executive Functions  Concentration:Fair  Attention Span:Fair  Recall:Fair  Fund of Knowledge:Fair  Language:Fair   Psychomotor Activity  Psychomotor Activity: Normal  Assets  Assets:Communication Skills; Desire for Improvement   Sleep  Sleep: 7.25 hours   Physical Exam ROS Physical Exam Constitutional:      Appearance: the patient is not toxic-appearing.  Pulmonary:     Effort: Pulmonary effort is normal.  Neurological:     General: No focal deficit present.     Mental Status: the patient is alert and oriented to person, place, and time.   Review of Systems  Respiratory:  Negative for shortness of breath.   Cardiovascular:  Negative for chest pain.  Gastrointestinal:  Negative for abdominal pain, constipation, diarrhea, nausea and vomiting.  Neurological:  Negative for headaches.  MSK: positive for back pain  Blood pressure 119/80,  pulse 84, temperature 97.9 F (36.6 C), temperature source Oral, resp. rate 16, height 6' (1.829 m), weight 85.1 kg, SpO2 100%. Body mass index is 25.44 kg/m.  Treatment Plan Summary: Daily contact with patient to assess and evaluate symptoms and progress in treatment, Medication management, and Plan    ASSESSMENT: [name] [name] is a 52 y.o. male  with a past psychiatric history of MDD, ADHD, PTSD. Patient initially arrived to [facility] on 5/18, and admitted to [facility] under IVC for acute safety concerns and crisis stabalization, feels that his mother is out to get him and has frequent mood swings. PPHx is significant for MDD, and no history of Suicide Attempts, Self Injurious Behavior, or Prior Psychiatric Hospitalizations. PMHx is significant for HTN.   Patient continues to be circumstantial and have paranoia regarding his family members, medications. He is intrusive and circumstantial on exam with others as well. At the end of interview he agreed to starting abilify for additional mood stabilization.   Diagnoses / Active Problems: Substance-induced bipolar 2 disorder, current episode hypomanic (r/o delusional disorder) History of ADHD History of PTSD   PLAN: Safety and Monitoring:  --  INVOLUNTARY admission to inpatient psychiatric unit for safety, stabilization and treatment  -- Daily contact with patient to assess and evaluate symptoms and progress in treatment  -- Patient's case to be discussed in multi-disciplinary team meeting  -- Observation Level : q15 minute checks  -- Vital signs:  q12 hours  -- Precautions: suicide, elopement, and assault  2. Psychiatric Diagnoses and Treatment:  --start abilify 5mg  for paranoia, delusions and additional mood stabilization, patient declined at this time.  --start hydroxyzine  25mg  TID PRN for anxiety --start trazodone  50mg  at bedtime PRN for insomnia            --stop effexor  XR 150mg  for depression given patient currently hypomanic             --stop home adderall for ADHD    -- The risks/benefits/side-effects/alternatives to this medication were discussed in detail with the patient and time was given for questions. The patient consents to medication trial.              -- Metabolic profile and EKG monitoring obtained while on an atypical antipsychotic  BMI: Body mass index is 25.44 kg/m. TSH:  Lab Results  Component Value Date   TSH 2.375 06/05/2023   Lipid Panel:  Lab Results  Component Value Date   CHOL 173 06/05/2023   HDL 43 06/05/2023   LDLCALC 116 (H) 06/05/2023   TRIG 69 06/05/2023   CHOLHDL 4.0 06/05/2023   HbgA1c:  Lab Results  Component Value Date   HGBA1C 5.2 06/05/2023    QTc: 385 (5/17)              -- Encouraged patient to participate in unit milieu and in scheduled group therapies   -- Short Term Goals: Ability to identify changes in lifestyle to reduce recurrence of condition will improve, Ability to verbalize feelings will improve, Ability to demonstrate self-control will improve, Ability to identify and develop effective coping behaviors will improve, Ability to maintain clinical measurements within normal limits will improve, Compliance with prescribed medications will improve, and Ability to identify triggers associated with substance abuse/mental health issues will improve  -- Long Term Goals: Improvement in symptoms so as ready for discharge  Other PRNS:  acetaminophen , alum & mag hydroxide-simeth, haloperidol  **AND** diphenhydrAMINE , haloperidol  lactate **AND** diphenhydrAMINE  **AND** LORazepam , haloperidol  lactate **AND** diphenhydrAMINE  **AND** LORazepam , hydrOXYzine , loperamide , magnesium  hydroxide, oxyCODONE , traZODone   -- As needed agitation protocol in-place   3. Medical Issues Being Addressed:   #HTN --continue home lisinopril -HCTZ   #Chronic pain Continue home oxy, changed to 10mg  Q6H PRN  Continue home lyrica  150 BID   4. Discharge Planning:   -- Social work and case  management to assist with discharge planning and identification of hospital follow-up needs prior to discharge  -- Estimated LOS: 5-7 days  -- Discharge Concerns: Need to establish a safety plan; Medication compliance and effectiveness  -- Discharge Goals: Return home with outpatient referrals for mental health follow-up including medication management/psychotherapy   I certify that inpatient services furnished can reasonably be expected to improve the patient's condition.   This note was created using a voice recognition software as a result there may be grammatical errors inadvertently enclosed that do not reflect the nature of this encounter. Every attempt is made to correct such errors.   This case was discussed with attending Dr. Jerrold Morgan who agrees with the above formulated treatment plan. Please see attending attestation for additional details.   Dr. Norbert Bean, MD PGY-2, Psychiatry Residency  5/20/20256:55 AM

## 2023-06-07 NOTE — Progress Notes (Signed)
   06/06/23 2200  Psych Admission Type (Psych Patients Only)  Admission Status Involuntary  Psychosocial Assessment  Eye Contact Fair  Facial Expression Pained;Pensive  Affect Anxious;Depressed  Speech Logical/coherent  Interaction Assertive  Motor Activity Slow  Appearance/Hygiene Unremarkable  Behavior Characteristics Irritable  Mood Irritable  Thought Process  Coherency WDL  Content Blaming others  Delusions None reported or observed  Perception WDL  Hallucination None reported or observed  Judgment WDL  Confusion WDL  Danger to Self  Current suicidal ideation? Denies  Agreement Not to Harm Self Yes  Description of Agreement verbal  Danger to Others  Danger to Others None reported or observed

## 2023-06-07 NOTE — Group Note (Signed)
 Date:  06/07/2023 Time:  9:20 PM  Group Topic/Focus:  Wrap-Up Group:   The focus of this group is to help patients review their daily goal of treatment and discuss progress on daily workbooks.    Participation Level:  Active  Participation Quality:  Appropriate, Attentive, and Supportive  Affect:  Appropriate  Cognitive:  Alert and Appropriate  Insight: Appropriate  Engagement in Group:  Engaged and Supportive  Modes of Intervention:  Discussion and Education  Additional Comments:  Pt attended and participated in wrap up group this evening and rated their day a 7 or 8/10. Pt stated that they are now realizing that "things are not as hard as it could be". Pt did not elaborate more about what this statement means. Pt also stated that they did not complete their goal, stating "I didn't get what I needed". Pt would not elaborate on what their goal was or what resources they did not receive. Pt has no complaints to relay to the Dr at this moment.   [name] [name] 06/07/2023, 9:20 PM

## 2023-06-07 NOTE — Plan of Care (Signed)
  Problem: Education: Goal: Knowledge of [facility] Education information/materials will improve Outcome: Progressing Goal: Mental status will improve Outcome: Progressing   Problem: Coping: Goal: Ability to verbalize frustrations and anger appropriately will improve Outcome: Progressing   Problem: Health Behavior/Discharge Planning: Goal: Identification of resources available to assist in meeting health care needs will improve Outcome: Progressing

## 2023-06-07 NOTE — Group Note (Signed)
 Recreation Therapy Group Note   Group Topic:Animal Assisted Therapy   Group Date: 06/07/2023 Start Time: 0945 End Time: 1030 Facilitators: Yeiren Whitecotton-McCall, LRT,CTRS Location: [address] Dayroom   Animal-Assisted Activity (AAA) Program Checklist/Progress Notes Patient Eligibility Criteria Checklist & Daily Group note for Rec Tx Intervention  AAA/T Program Assumption of Risk Form signed by Patient/ or Parent Legal Guardian Yes  Patient understands his/her participation is voluntary Yes  Behavioral Response:    Education: Charity fundraiser, Appropriate Animal Interaction   Education Outcome: Acknowledges education.    Affect/Mood: N/A   Participation Level: Did not attend    Clinical Observations/Individualized Feedback:     Plan: Continue to engage patient in RT group sessions 2-3x/week.   Orton Capell-McCall, LRT,CTRS 06/07/2023 1:16 PM

## 2023-06-07 NOTE — Progress Notes (Signed)
   06/07/23 0815  Psych Admission Type (Psych Patients Only)  Admission Status Involuntary  Psychosocial Assessment  Patient Complaints Anxiety  Eye Contact Fair  Facial Expression Pained  Affect Angry;Depressed  Speech Logical/coherent  Interaction Assertive  Motor Activity Slow  Appearance/Hygiene Body odor  Behavior Characteristics Cooperative;Appropriate to situation  Mood Anxious  Thought Process  Coherency WDL  Content Blaming others  Delusions None reported or observed  Perception WDL  Hallucination None reported or observed  Judgment Impaired  Confusion None  Danger to Self  Current suicidal ideation? Denies  Agreement Not to Harm Self Yes  Description of Agreement verbal  Danger to Others  Danger to Others None reported or observed

## 2023-06-07 NOTE — Progress Notes (Signed)
   06/07/23 2200  Psych Admission Type (Psych Patients Only)  Admission Status Involuntary  Psychosocial Assessment  Patient Complaints Anxiety  Eye Contact Fair  Facial Expression Flat  Affect Anxious;Depressed  Speech Logical/coherent  Motor Activity Slow  Appearance/Hygiene Unremarkable  Behavior Characteristics Cooperative;Anxious  Mood Anxious  Thought Process  Coherency WDL  Content Blaming others  Delusions None reported or observed  Perception WDL  Hallucination None reported or observed  Judgment Impaired  Confusion None  Danger to Self  Current suicidal ideation? Denies  Agreement Not to Harm Self Yes  Description of Agreement verbal  Danger to Others  Danger to Others None reported or observed

## 2023-06-07 NOTE — Group Note (Signed)
 LCSW Group Therapy Note   Group Date: 06/07/2023 Start Time: 1100 End Time: 1200   Participation:  patient was present.  He was respectful and listened but didn't participate in the discussion.  Type of Therapy:  Group Therapy  Topic:  "Healing Flames: Navigating Anger with Compassion"  Objective:  Foster self-awareness and promote compassion toward oneself and others when dealing with anger.  Goals:  Help participants understand the underlying emotions and needs fueling anger. Provide coping strategies for healthier emotional expression and anger management.  Summary: This session explored anger as a volcano--an explosion driven by deeper feelings and unmet needs. Participants learned to identify anger triggers and underlying emotions, then practiced coping strategies like deep breathing, physical activity, and journaling. The group discussed healthy ways to manage anger before it escalates, using both personal reflection and shared experiences.  Therapeutic Modalities: Cognitive Behavioral Therapy (CBT): Challenging thoughts that fuel anger. Mindfulness: Increasing awareness of emotions and sensations.   [name] [name], LCSWA 06/07/2023  12:51 PM

## 2023-06-07 NOTE — [facility] Suicide Risk Assessment (Signed)
 [facility] INPATIENT:  Family/Significant Other Suicide Prevention Education  Suicide Prevention Education:  Education Completed; [name] [name] (Father) [phone],  (name of family member/significant other) has been identified by the patient as the family member/significant other with whom the patient will be residing, and identified as the person(s) who will aid the patient in the event of a mental health crisis (suicidal ideations/suicide attempt).  With written consent from the patient, the family member/significant other has been provided the following suicide prevention education, prior to the and/or following the discharge of the patient.  Pt resides with mother and father (however typically tells people he has his own place). Able to return home at discharge. Will not have access to weapons or firearms at discharge. Will be picked up at parents.   The suicide prevention education provided includes the following: Suicide risk factors Suicide prevention and interventions National Suicide Hotline telephone number [facility] assessment telephone number [facility] Emergency Assistance [address] [facility] and/or Residential Mobile Crisis Unit telephone number  Request made of family/significant other to: Remove weapons (e.g., guns, rifles, knives), all items previously/currently identified as safety concern.   Remove drugs/medications (over-the-counter, prescriptions, illicit drugs), all items previously/currently identified as a safety concern.  The family member/significant other verbalizes understanding of the suicide prevention education information provided.  The family member/significant other agrees to remove the items of safety concern listed above.  [name] [name] 06/07/2023, 10:19 AM

## 2023-06-08 DIAGNOSIS — T50905A Adverse effect of unspecified drugs, medicaments and biological substances, initial encounter: Secondary | ICD-10-CM | POA: Diagnosis not present

## 2023-06-08 DIAGNOSIS — F3189 Other bipolar disorder: Secondary | ICD-10-CM | POA: Diagnosis not present

## 2023-06-08 MED ORDER — BENZOCAINE 10 % MT GEL
Freq: Four times a day (QID) | OROMUCOSAL | Status: DC | PRN
  Administered 2023-06-09: 1 via OROMUCOSAL
  Filled 2023-06-08: qty 9

## 2023-06-08 MED ORDER — ARIPIPRAZOLE 5 MG PO TABS: 5.0000 mg | ORAL_TABLET | Freq: Every day | ORAL | Status: DC

## 2023-06-08 NOTE — Progress Notes (Signed)
   06/08/23 0551  15 Minute Checks  Location Bedroom  Visual Appearance Calm  Behavior Sleeping  Sleep (Behavioral Health Patients Only)  Calculate sleep? (Click Yes once per 24 hr at 0600 safety check) Yes  Documented sleep last 24 hours 6

## 2023-06-08 NOTE — Plan of Care (Signed)

## 2023-06-08 NOTE — Progress Notes (Signed)
   06/08/23 2000  Psych Admission Type (Psych Patients Only)  Admission Status Involuntary  Psychosocial Assessment  Patient Complaints Depression  Eye Contact Fair  Facial Expression Flat  Affect Depressed  Speech Logical/coherent  Interaction Assertive  Motor Activity Slow  Appearance/Hygiene Unremarkable  Behavior Characteristics Cooperative  Mood Depressed  Thought Process  Coherency WDL  Content WDL  Delusions None reported or observed  Perception WDL  Hallucination None reported or observed  Judgment Impaired  Confusion None  Danger to Self  Current suicidal ideation? Denies  Agreement Not to Harm Self Yes  Description of Agreement verbal  Danger to Others  Danger to Others None reported or observed

## 2023-06-08 NOTE — Group Note (Signed)
 Recreation Therapy Group Note   Group Topic:Communication  Group Date: 06/08/2023 Start Time: 5409 End Time: 1000 Facilitators: Nairi Oswald-McCall, LRT,CTRS Location: [address] Dayroom   Group Topic: Communication, Team Building, Problem Solving  Goal Area(s) Addresses:  Patient will effectively work with peer towards shared goal.  Patient will identify skills used to make activity successful.  Patient will identify how skills used during activity can be applied to reach post d/c goals.   Behavioral Response: N/A  Intervention: STEM Activity- Glass blower/designer  Activity: Tallest [employer]. In teams of 5-6, patients were given 11 craft pipe cleaners. Using the materials provided, patients were instructed to compete again the opposing team(s) to build the tallest free-standing structure from floor level. The activity was timed; difficulty increased by Clinical research associate as Production designer, theatre/television/film continued.  Systematically resources were removed with additional directions for example, placing one arm behind their back, working in silence, and shape stipulations. LRT facilitated post-activity discussion reviewing team processes and necessary communication skills involved in completion. Patients were encouraged to reflect how the skills utilized, or not utilized, in this activity can be incorporated to positively impact support systems post discharge.  Education: Pharmacist, community, Scientist, physiological, Discharge Planning   Education Outcome: Acknowledges education/In group clarification offered/Needs additional education.    Affect/Mood: N/A   Participation Level: Did not attend    Clinical Observations/Individualized Feedback:     Plan: Continue to engage patient in RT group sessions 2-3x/week.   [name] [name], LRT,CTRS 06/08/2023 12:58 PM

## 2023-06-08 NOTE — Progress Notes (Signed)
   06/08/23 0841  Psych Admission Type (Psych Patients Only)  Admission Status Involuntary  Psychosocial Assessment  Patient Complaints Anxiety;Depression  Eye Contact Fair  Facial Expression Flat  Affect Appropriate to circumstance  Speech Logical/coherent  Interaction Attention-seeking  Motor Activity Slow  Appearance/Hygiene Unremarkable  Behavior Characteristics Cooperative;Appropriate to situation  Mood Anxious;Depressed  Thought Process  Coherency WDL  Content WDL  Delusions None reported or observed  Perception WDL  Hallucination None reported or observed  Judgment Impaired  Confusion None  Danger to Self  Current suicidal ideation? Denies  Description of Suicide Plan No plan  Agreement Not to Harm Self Yes  Description of Agreement Verbal  Danger to Others  Danger to Others None reported or observed

## 2023-06-08 NOTE — Progress Notes (Signed)
 [facility] MD Progress Note  06/08/2023 6:38 AM SATISH HAMMERS  MRN:  [id number]  Principal Problem: Drug-induced bipolar disorder [facility]) Diagnosis: Principal Problem:   Drug-induced bipolar disorder (HCC) Active Problems:   MDD (major depressive disorder)  Reason for Admission:  [name] [name] is a 53 y.o. male  with a past psychiatric history of MDD, ADHD, PTSD. Patient initially arrived to [facility] on 5/18, and admitted to [facility] under IVC for acute safety concerns and crisis stabalization, feels that his mother is out to get him and has frequent mood swings. PPHx is significant for MDD, and no history of Suicide Attempts, Self Injurious Behavior, or Prior Psychiatric Hospitalizations. PMHx is significant for HTN.   (admitted on 06/05/2023, total  LOS: 3 days )  Chart review: Overnight Events Vital signs: stable  MAR was reviewed and patient was compliant with medications.  Patient received PRN oxycodone  every 6 hours Sleep: 6 hours Nursing notes /groups: patient attended 3/4 groups.   Pertinent information discussed during bed progression:  Case was discussed in the multidisciplinary team. Reports patient appears less circumstantial. Reports patient slept 6 hours, reports tooth pain.   Information Obtained Today During Patient Interview: Patient evaluated on the unit. Reports no issues with sleep or appetite. Reports some tooth pain from a cracked tooth. Discussed can add orajel. He denies SI/HI/AVH. He is less circumstantial and tangential though he is perseverative on discharge. He reports feeling somnolence with the abilify, discussed that we can change to nighttime dosing. He denies feeling [place]. He reports his goal is to make it through the day. No paranoid ideation brought up in interview today.   Past Psychiatric History:  History of ADHD, MDD, GAD and PTSD. No past inpatient psychiatric hospitalizations.    Current Psychiatrist: [facility]  Current Therapist:  none Previous Psychiatric Diagnoses: MDD, GAD, ADHD, PTSD  Current psychiatric medications: pristiq (not taking due to not helping), adderall Psychiatric medication history/compliance: effexor , gabapentin  300 TID, prozac, paxil, trintellix, vraylar (not taken 2/2 patient concern about side effects) Psychiatric Hospitalization hx: none Psychotherapy hx: denies Neuromodulation history: denies History of suicide (obtained from HPI): denies History of homicide or aggression (obtained in HPI): aggression towards mother prior to admission per IVC   Family Psychiatric History: Reports mother with depression   Social History:  Patient resides with his parents. He is unemployed and on disability.    Living Situation: was living with his parents  Education: reports graduated high school Occupational hx: on disability for back pain Marital Status: single  Children: reports 1 daughter born in 37, reports 1 grandchild  Legal: reports was in jail years ago for drinking and Ambulance person: denies   Access to firearms: denies though has access to [employer]  Medication stockpile: denies   Past Medical History:  Past Medical History:  Diagnosis Date   Back pain, chronic    Hypertension    Family History:  Family History  Problem Relation Age of Onset   Cancer Mother    Hypertension Mother    Hypertension Father    Diabetes Father    Diabetes Maternal Uncle     Current Medications: Current Facility-Administered Medications  Medication Dose Route Frequency Provider Last Rate Last Admin   acetaminophen  (TYLENOL ) tablet 650 mg  650 mg Oral Q6H PRN White, Patrice L, NP       alum & mag hydroxide-simeth (MAALOX/MYLANTA) 200-200-20 MG/5ML suspension 30 mL  30 mL Oral Q4H PRN White, Robby Chime, NP  ARIPiprazole (ABILIFY) tablet 5 mg  5 mg Oral Daily Daneli Butkiewicz, MD   5 mg at 06/07/23 1200   haloperidol  (HALDOL ) tablet 5 mg  5 mg Oral TID PRN Nickola Baron L, NP       And    diphenhydrAMINE  (BENADRYL ) capsule 50 mg  50 mg Oral TID PRN White, Patrice L, NP       haloperidol  lactate (HALDOL ) injection 5 mg  5 mg Intramuscular TID PRN White, Patrice L, NP       And   diphenhydrAMINE  (BENADRYL ) injection 50 mg  50 mg Intramuscular TID PRN White, Patrice L, NP       And   LORazepam  (ATIVAN ) injection 2 mg  2 mg Intramuscular TID PRN White, Patrice L, NP       haloperidol  lactate (HALDOL ) injection 10 mg  10 mg Intramuscular TID PRN White, Patrice L, NP       And   diphenhydrAMINE  (BENADRYL ) injection 50 mg  50 mg Intramuscular TID PRN White, Patrice L, NP       And   LORazepam  (ATIVAN ) injection 2 mg  2 mg Intramuscular TID PRN White, Patrice L, NP       hydrochlorothiazide  (HYDRODIURIL ) tablet 25 mg  25 mg Oral Daily White, Patrice L, NP   25 mg at 06/07/23 1610   hydrOXYzine  (ATARAX ) tablet 25 mg  25 mg Oral TID PRN Nickola Baron L, NP       lisinopril  (ZESTRIL ) tablet 20 mg  20 mg Oral Daily White, Patrice L, NP   20 mg at 06/07/23 9604   loperamide  (IMODIUM ) capsule 2-4 mg  2-4 mg Oral PRN White, Patrice L, NP       magnesium  hydroxide (MILK OF MAGNESIA) suspension 30 mL  30 mL Oral Daily PRN White, Patrice L, NP       oxyCODONE  (Oxy IR/ROXICODONE ) immediate release tablet 10 mg  10 mg Oral Q6H PRN Izediuno, Iline Mallory, MD   10 mg at 06/07/23 2104   pregabalin  (LYRICA ) capsule 150 mg  150 mg Oral BID White, Patrice L, NP   150 mg at 06/07/23 1707   traZODone  (DESYREL ) tablet 50 mg  50 mg Oral QHS PRN White, Patrice L, NP        Lab Results: No results found for this or any previous visit (from the past 48 hours).  Blood Alcohol level:  Lab Results  Component Value Date   ETH <15 06/05/2023   ETH (H) 03/28/2010    24        LOWEST DETECTABLE LIMIT FOR SERUM ALCOHOL IS 5 mg/dL FOR MEDICAL PURPOSES ONLY    Metabolic Labs: Lab Results  Component Value Date   HGBA1C 5.2 06/05/2023   MPG 102.54 06/05/2023   No results found for: "PROLACTIN" Lab Results   Component Value Date   CHOL 173 06/05/2023   TRIG 69 06/05/2023   HDL 43 06/05/2023   CHOLHDL 4.0 06/05/2023   VLDL 14 06/05/2023   LDLCALC 116 (H) 06/05/2023   LDLCALC 96 05/07/2013    Sleep: 8 hours  Physical Findings: AIMS: No  CIWA:  CIWA-Ar Total: 0 COWS:     Psychiatric Specialty Exam:  Presentation  General Appearance: Appropriate for Environment  Eye Contact:Fair  Speech:Clear and Coherent  Speech Volume:Normal  Handedness:Right   Mood and Affect  Mood: "fine"  Affect: flat, Congruent   Thought Process  Thought Processes: Circumstantial  Descriptions of Associations: intact  Orientation:Full (Time, Place and Person)  Thought Content: perseverative on discharge  History of Schizophrenia/Schizoaffective disorder:No  Duration of Psychotic Symptoms: > 6 months Hallucinations: denies  Ideas of Reference: none noted today  Suicidal Thoughts: Denies Homicidal Thoughts: Denies  Sensorium  Memory:Immediate Fair  Judgment:Poor  Insight:Poor   Executive Functions  Concentration:Fair  Attention Span:Fair  Recall:Fair  Fund of Knowledge:Fair  Language:Fair   Psychomotor Activity  Psychomotor Activity: Normal  Assets  Assets:Communication Skills; Desire for Improvement   Sleep  Sleep: 6 hours   Physical Exam ROS Physical Exam Constitutional:      Appearance: the patient is not toxic-appearing.  Pulmonary:     Effort: Pulmonary effort is normal.  Neurological:     General: No focal deficit present.     Mental Status: the patient is alert and oriented to person, place, and time.   Review of Systems  Respiratory:  Negative for shortness of breath.   Cardiovascular:  Negative for chest pain.  Gastrointestinal:  Negative for abdominal pain, constipation, diarrhea, nausea and vomiting.  Neurological:  Negative for headaches.  MSK: positive for back pain HEENT: positive for tooth pain  Blood pressure 100/79, pulse 82,  temperature 98.2 F (36.8 C), temperature source Oral, resp. rate 20, height 6' (1.829 m), weight 85.1 kg, SpO2 99%. Body mass index is 25.44 kg/m.  Treatment Plan Summary: Daily contact with patient to assess and evaluate symptoms and progress in treatment, Medication management, and Plan    ASSESSMENT: [name] [name] is a 53 y.o. male  with a past psychiatric history of MDD, ADHD, PTSD. Patient initially arrived to [facility] on 5/18, and admitted to [facility] under IVC for acute safety concerns and crisis stabalization, feels that his mother is out to get him and has frequent mood swings. PPHx is significant for MDD, and no history of Suicide Attempts, Self Injurious Behavior, or Prior Psychiatric Hospitalizations. PMHx is significant for HTN.   Patient appears to be less circumstantial and impulsive on exam today. He is reporting somnolence as a side effect to abilify but denies [place] and does not appear akathetic. Will change abilify to nighttime dosing. Adding orajel for tooth pain.   Diagnoses / Active Problems: Substance-induced bipolar 2 disorder, current episode hypomanic (r/o delusional disorder) History of ADHD History of PTSD   PLAN: Safety and Monitoring:  --  INVOLUNTARY admission to inpatient psychiatric unit for safety, stabilization and treatment  -- Daily contact with patient to assess and evaluate symptoms and progress in treatment  -- Patient's case to be discussed in multi-disciplinary team meeting  -- Observation Level : q15 minute checks  -- Vital signs:  q12 hours  -- Precautions: suicide, elopement, and assault  2. Psychiatric Diagnoses and Treatment:  --continue abilify 5mg  for paranoia, delusions and additional mood stabilization, change to nighttime dosing given somnolence --continue hydroxyzine  25mg  TID PRN for anxiety --continue trazodone  50mg  at bedtime PRN for insomnia --start oragel for tooth pain, recommend outpatient follow-up with dentist              --stop effexor  XR 150mg  for depression given patient currently hypomanic            --stop home adderall for ADHD    -- The risks/benefits/side-effects/alternatives to this medication were discussed in detail with the patient and time was given for questions. The patient consents to medication trial.              -- Metabolic profile and EKG monitoring obtained while on an atypical antipsychotic  BMI: Body mass  index is 25.44 kg/m. TSH:  Lab Results  Component Value Date   TSH 2.375 06/05/2023   Lipid Panel:  Lab Results  Component Value Date   CHOL 173 06/05/2023   HDL 43 06/05/2023   LDLCALC 116 (H) 06/05/2023   TRIG 69 06/05/2023   CHOLHDL 4.0 06/05/2023   HbgA1c:  Lab Results  Component Value Date   HGBA1C 5.2 06/05/2023    QTc: 385 (5/17)              -- Encouraged patient to participate in unit milieu and in scheduled group therapies   -- Short Term Goals: Ability to identify changes in lifestyle to reduce recurrence of condition will improve, Ability to verbalize feelings will improve, Ability to demonstrate self-control will improve, Ability to identify and develop effective coping behaviors will improve, Ability to maintain clinical measurements within normal limits will improve, Compliance with prescribed medications will improve, and Ability to identify triggers associated with substance abuse/mental health issues will improve  -- Long Term Goals: Improvement in symptoms so as ready for discharge  Other PRNS:  acetaminophen , alum & mag hydroxide-simeth, haloperidol  **AND** diphenhydrAMINE , haloperidol  lactate **AND** diphenhydrAMINE  **AND** LORazepam , haloperidol  lactate **AND** diphenhydrAMINE  **AND** LORazepam , hydrOXYzine , loperamide , magnesium  hydroxide, oxyCODONE , traZODone   -- As needed agitation protocol in-place   3. Medical Issues Being Addressed:   #HTN --continue home lisinopril -HCTZ   #Chronic pain Continue home oxy, changed to 10mg  Q6H PRN  Continue  home lyrica  150 BID   4. Discharge Planning:   -- Social work and case management to assist with discharge planning and identification of hospital follow-up needs prior to discharge  -- Estimated LOS: 5-7 days, possible 5/26-27  -- Discharge Concerns: Need to establish a safety plan; Medication compliance and effectiveness  -- Discharge Goals: Return home with outpatient referrals for mental health follow-up including medication management/psychotherapy   I certify that inpatient services furnished can reasonably be expected to improve the patient's condition.   This note was created using a voice recognition software as a result there may be grammatical errors inadvertently enclosed that do not reflect the nature of this encounter. Every attempt is made to correct such errors.   This case was discussed with attending Dr. Jerrold Morgan who agrees with the above formulated treatment plan. Please see attending attestation for additional details.   Dr. Norbert Bean, MD PGY-2, Psychiatry Residency  5/21/20256:38 AM

## 2023-06-08 NOTE — Plan of Care (Signed)
   Problem: Education: Goal: Emotional status will improve Outcome: Progressing Goal: Mental status will improve Outcome: Progressing   Problem: Activity: Goal: Interest or engagement in activities will improve Outcome: Progressing Goal: Sleeping patterns will improve Outcome: Progressing

## 2023-06-08 NOTE — Progress Notes (Signed)
 Adult Psychoeducational Group Note  Date:  06/08/2023 Time:  8:29 PM  Group Topic/Focus:  Wrap-Up Group:   The focus of this group is to help patients review their daily goal of treatment and discuss progress on daily workbooks.  Participation Level:  Active  Participation Quality:  Appropriate  Affect:  Appropriate  Cognitive:  Appropriate  Insight: Appropriate  Engagement in Group:  Engaged  Modes of Intervention:  Discussion  Additional Comments:  jonthan attend NA wrap up group  [name] [name] 06/08/2023, 8:29 PM

## 2023-06-09 ENCOUNTER — Other Ambulatory Visit (HOSPITAL_COMMUNITY): Payer: Self-pay

## 2023-06-09 ENCOUNTER — Telehealth (HOSPITAL_COMMUNITY): Payer: Self-pay | Admitting: Pharmacy Technician

## 2023-06-09 DIAGNOSIS — F3189 Other bipolar disorder: Secondary | ICD-10-CM | POA: Diagnosis not present

## 2023-06-09 DIAGNOSIS — T50905A Adverse effect of unspecified drugs, medicaments and biological substances, initial encounter: Secondary | ICD-10-CM | POA: Diagnosis not present

## 2023-06-09 MED ORDER — OXYCODONE HCL 5 MG PO TABS
5.0000 mg | ORAL_TABLET | Freq: Four times a day (QID) | ORAL | Status: DC | PRN
  Administered 2023-06-09 – 2023-06-13 (×16): 5 mg via ORAL
  Filled 2023-06-09 (×16): qty 1

## 2023-06-09 MED ORDER — ARIPIPRAZOLE 10 MG PO TABS
10.0000 mg | ORAL_TABLET | Freq: Every day | ORAL | Status: DC
  Administered 2023-06-09 – 2023-06-13 (×5): 10 mg via ORAL
  Filled 2023-06-09 (×5): qty 1

## 2023-06-09 NOTE — Progress Notes (Addendum)
 [facility] MD Progress Note  06/09/2023 6:42 AM [name] [name]  MRN:  [id number]  Principal Problem: Drug-induced bipolar disorder [facility]) Diagnosis: Principal Problem:   Drug-induced bipolar disorder (HCC) Active Problems:   MDD (major depressive disorder)  Reason for Admission:  [name] [name] is a 53 y.o. male  with a past psychiatric history of MDD, ADHD, PTSD. Patient initially arrived to [facility] on 5/18, and admitted to [facility] under IVC for acute safety concerns and crisis stabalization, feels that his mother is out to get him and has frequent mood swings. PPHx is significant for MDD, and no history of Suicide Attempts, Self Injurious Behavior, or Prior Psychiatric Hospitalizations. PMHx is significant for HTN.   (admitted on 06/05/2023, total  LOS: 4 days )  Chart review: Overnight Events Vital signs: stable  MAR was reviewed and patient was compliant with medications.  Patient received PRN oxycodone  every 6 hours Sleep: 6.5 hours Nursing notes /groups: patient attended 1/2 groups.   Pertinent information discussed during bed progression:  Case was discussed in the multidisciplinary team. Reports patient slept 6 hours, reports tooth pain.   Information Obtained Today During Patient Interview: Patient evaluated on the unit. He is more intrusive and circumstantial today compared to yesterday. He is perseverative on his tooth pain. He denies issues with sleep or appetite. He states that his mood is okay besides his tooth hurting. When asked about prior paranoia at his home, he denies feeling increased paranoia at his home and states that it was his dad that was thinking someone was living in the attic. Even though he reports feeling more somnolent, he was noted to be active on the unit and able to go to groups. He denies SI/HI/AVH and is perseverative on discharge and also on his ADHD medications.   Patient was also seen later in the day, out of the bed and talking with this roommate.    Past Psychiatric History:  History of ADHD, MDD, GAD and PTSD. No past inpatient psychiatric hospitalizations.    Current Psychiatrist: [facility]  Current Therapist: none Previous Psychiatric Diagnoses: MDD, GAD, ADHD, PTSD  Current psychiatric medications: pristiq (not taking due to not helping), adderall Psychiatric medication history/compliance: effexor , gabapentin  300 TID, prozac, paxil, trintellix, vraylar (not taken 2/2 patient concern about side effects) Psychiatric Hospitalization hx: none Psychotherapy hx: denies Neuromodulation history: denies History of suicide (obtained from HPI): denies History of homicide or aggression (obtained in HPI): aggression towards mother prior to admission per IVC   Family Psychiatric History: Reports mother with depression   Social History:  Patient resides with his parents. He is unemployed and on disability.    Living Situation: was living with his parents  Education: reports graduated high school Occupational hx: on disability for back pain Marital Status: single  Children: reports 1 daughter born in 50, reports 1 grandchild  Legal: reports was in jail years ago for drinking and Ambulance person: denies   Access to firearms: denies though has access to [employer]  Medication stockpile: denies   Past Medical History:  Past Medical History:  Diagnosis Date   Back pain, chronic    Hypertension    Family History:  Family History  Problem Relation Age of Onset   Cancer Mother    Hypertension Mother    Hypertension Father    Diabetes Father    Diabetes Maternal Uncle     Current Medications: Current Facility-Administered Medications  Medication Dose Route Frequency Provider Last Rate Last Admin   acetaminophen  (  TYLENOL ) tablet 650 mg  650 mg Oral Q6H PRN White, Patrice L, NP       alum & mag hydroxide-simeth (MAALOX/MYLANTA) 200-200-20 MG/5ML suspension 30 mL  30 mL Oral Q4H PRN White, Patrice L, NP        ARIPiprazole (ABILIFY) tablet 5 mg  5 mg Oral QHS Linley Moskal, MD       benzocaine (ORAJEL) 10 % mucosal gel   Mouth/Throat QID PRN Norbert Bean, MD   Given at 06/08/23 1349   haloperidol  (HALDOL ) tablet 5 mg  5 mg Oral TID PRN Nickola Baron L, NP       And   diphenhydrAMINE  (BENADRYL ) capsule 50 mg  50 mg Oral TID PRN White, Patrice L, NP       haloperidol  lactate (HALDOL ) injection 5 mg  5 mg Intramuscular TID PRN White, Patrice L, NP       And   diphenhydrAMINE  (BENADRYL ) injection 50 mg  50 mg Intramuscular TID PRN White, Patrice L, NP       And   LORazepam  (ATIVAN ) injection 2 mg  2 mg Intramuscular TID PRN White, Patrice L, NP       haloperidol  lactate (HALDOL ) injection 10 mg  10 mg Intramuscular TID PRN White, Patrice L, NP       And   diphenhydrAMINE  (BENADRYL ) injection 50 mg  50 mg Intramuscular TID PRN White, Patrice L, NP       And   LORazepam  (ATIVAN ) injection 2 mg  2 mg Intramuscular TID PRN White, Patrice L, NP       hydrochlorothiazide  (HYDRODIURIL ) tablet 25 mg  25 mg Oral Daily White, Patrice L, NP   25 mg at 06/08/23 0745   hydrOXYzine  (ATARAX ) tablet 25 mg  25 mg Oral TID PRN White, Patrice L, NP       lisinopril  (ZESTRIL ) tablet 20 mg  20 mg Oral Daily White, Patrice L, NP   20 mg at 06/08/23 0745   magnesium  hydroxide (MILK OF MAGNESIA) suspension 30 mL  30 mL Oral Daily PRN White, Patrice L, NP       oxyCODONE  (Oxy IR/ROXICODONE ) immediate release tablet 10 mg  10 mg Oral Q6H PRN Izediuno, Iline Mallory, MD   10 mg at 06/08/23 2124   pregabalin  (LYRICA ) capsule 150 mg  150 mg Oral BID White, Patrice L, NP   150 mg at 06/08/23 1659   traZODone  (DESYREL ) tablet 50 mg  50 mg Oral QHS PRN White, Patrice L, NP        Lab Results: No results found for this or any previous visit (from the past 48 hours).  Blood Alcohol level:  Lab Results  Component Value Date   ETH <15 06/05/2023   ETH (H) 03/28/2010    24        LOWEST DETECTABLE LIMIT FOR SERUM ALCOHOL IS 5  mg/dL FOR MEDICAL PURPOSES ONLY    Metabolic Labs: Lab Results  Component Value Date   HGBA1C 5.2 06/05/2023   MPG 102.54 06/05/2023   No results found for: "PROLACTIN" Lab Results  Component Value Date   CHOL 173 06/05/2023   TRIG 69 06/05/2023   HDL 43 06/05/2023   CHOLHDL 4.0 06/05/2023   VLDL 14 06/05/2023   LDLCALC 116 (H) 06/05/2023   LDLCALC 96 05/07/2013    Sleep: 8 hours  Physical Findings: AIMS: No  CIWA:  CIWA-Ar Total: 0 COWS:     Psychiatric Specialty Exam:  Presentation  General Appearance: Appropriate for  Environment  Eye Contact:Fair  Speech:Clear and Coherent  Speech Volume:Normal  Handedness:Right   Mood and Affect  Mood: "doing okay"  Affect: flat, Congruent   Thought Process  Thought Processes: Circumstantial  Descriptions of Associations: intact  Orientation:Full (Time, Place and Person)  Thought Content: perseverative on discharge, continued delusions regarding his family  History of Schizophrenia/Schizoaffective disorder:No  Duration of Psychotic Symptoms: > 6 months Hallucinations: denies  Ideas of Reference: none noted today  Suicidal Thoughts: Denies Homicidal Thoughts: Denies  Sensorium  Memory:Immediate Fair  Judgment:Poor  Insight:Poor   Executive Functions  Concentration:Fair  Attention Span:Fair  Recall:Fair  Fund of Knowledge:Fair  Language:Fair   Psychomotor Activity  Psychomotor Activity: Normal  Assets  Assets:Communication Skills; Desire for Improvement   Sleep  Sleep: 6 hours   Physical Exam ROS Physical Exam Constitutional:      Appearance: the patient is not toxic-appearing.  Pulmonary:     Effort: Pulmonary effort is normal.  Neurological:     General: No focal deficit present.     Mental Status: the patient is alert and oriented to person, place, and time.   Review of Systems  Respiratory:  Negative for shortness of breath.   Cardiovascular:  Negative for chest pain.   Gastrointestinal:  Negative for abdominal pain, constipation, diarrhea, nausea and vomiting.  Neurological:  Negative for headaches.  MSK: positive for back pain HEENT: positive for tooth pain  Blood pressure 109/86, pulse 73, temperature 97.8 F (36.6 C), temperature source Oral, resp. rate 14, height 6' (1.829 m), weight 85.1 kg, SpO2 100%. Body mass index is 25.44 kg/m.  Treatment Plan Summary: Daily contact with patient to assess and evaluate symptoms and progress in treatment, Medication management, and Plan    ASSESSMENT: [name] [name] is a 53 y.o. male  with a past psychiatric history of MDD, ADHD, PTSD. Patient initially arrived to [facility] on 5/18, and admitted to [facility] under IVC for acute safety concerns and crisis stabalization, feels that his mother is out to get him and has frequent mood swings. PPHx is significant for MDD, and no history of Suicide Attempts, Self Injurious Behavior, or Prior Psychiatric Hospitalizations. PMHx is significant for HTN.   Patient is still circumstantial and impulsive. He is reporting somnolence as a side effect to abilify but denies [place] and does not appear akathetic and is able to participate in groups. Given his continued intrusiveness and impulsivity on exam today will change his abilify back to daytime dosing (he did not receive nighttime dose) and will increase his abilify to 10mg .   Diagnoses / Active Problems: Substance-induced bipolar 2 disorder, current episode hypomanic (r/o delusional disorder) History of ADHD History of PTSD   PLAN: Safety and Monitoring:  --  INVOLUNTARY admission to inpatient psychiatric unit for safety, stabilization and treatment  -- Daily contact with patient to assess and evaluate symptoms and progress in treatment  -- Patient's case to be discussed in multi-disciplinary team meeting  -- Observation Level : q15 minute checks  -- Vital signs:  q12 hours  -- Precautions: suicide, elopement, and  assault  2. Psychiatric Diagnoses and Treatment:  --increase abilify 10mg  for paranoia, delusions and additional mood stabilization --continue hydroxyzine  25mg  TID PRN for anxiety --continue trazodone  50mg  at bedtime PRN for insomnia --start oragel for tooth pain, recommend outpatient follow-up with dentist --start COWS for irritability associated with opioid withdrawal, offered patient clonidine for ADHD and withdrawal symptoms however he declined.              --  stop effexor  XR 150mg  for depression given patient currently hypomanic            --stop home adderall for ADHD    -- The risks/benefits/side-effects/alternatives to this medication were discussed in detail with the patient and time was given for questions. The patient consents to medication trial.              -- Metabolic profile and EKG monitoring obtained while on an atypical antipsychotic  BMI: Body mass index is 25.44 kg/m. TSH:  Lab Results  Component Value Date   TSH 2.375 06/05/2023   Lipid Panel:  Lab Results  Component Value Date   CHOL 173 06/05/2023   HDL 43 06/05/2023   LDLCALC 116 (H) 06/05/2023   TRIG 69 06/05/2023   CHOLHDL 4.0 06/05/2023   HbgA1c:  Lab Results  Component Value Date   HGBA1C 5.2 06/05/2023    QTc: 385 (5/17)              -- Encouraged patient to participate in unit milieu and in scheduled group therapies   -- Short Term Goals: Ability to identify changes in lifestyle to reduce recurrence of condition will improve, Ability to verbalize feelings will improve, Ability to demonstrate self-control will improve, Ability to identify and develop effective coping behaviors will improve, Ability to maintain clinical measurements within normal limits will improve, Compliance with prescribed medications will improve, and Ability to identify triggers associated with substance abuse/mental health issues will improve  -- Long Term Goals: Improvement in symptoms so as ready for discharge  Other  PRNS:  acetaminophen , alum & mag hydroxide-simeth, benzocaine, haloperidol  **AND** diphenhydrAMINE , haloperidol  lactate **AND** diphenhydrAMINE  **AND** LORazepam , haloperidol  lactate **AND** diphenhydrAMINE  **AND** LORazepam , hydrOXYzine , magnesium  hydroxide, oxyCODONE , traZODone   -- As needed agitation protocol in-place   3. Medical Issues Being Addressed:   #HTN --continue home lisinopril -HCTZ   #Chronic pain Continue home oxy, changed to 10mg  Q6H PRN, plan to decrease to 5mg  Q6H PRN  Continue home lyrica  150 BID   4. Discharge Planning:   -- Social work and case management to assist with discharge planning and identification of hospital follow-up needs prior to discharge  -- Estimated LOS: 5-7 days, possible 5/26-27  -- Discharge Concerns: Need to establish a safety plan; Medication compliance and effectiveness  -- Discharge Goals: Return home with outpatient referrals for mental health follow-up including medication management/psychotherapy   I certify that inpatient services furnished can reasonably be expected to improve the patient's condition.   This note was created using a voice recognition software as a result there may be grammatical errors inadvertently enclosed that do not reflect the nature of this encounter. Every attempt is made to correct such errors.   This case was discussed with attending Dr. Jerrold Morgan who agrees with the above formulated treatment plan. Please see attending attestation for additional details.   Dr. Norbert Bean, MD PGY-2, Psychiatry Residency  5/22/20256:42 AM

## 2023-06-09 NOTE — Plan of Care (Signed)

## 2023-06-09 NOTE — Progress Notes (Signed)
   06/09/23 0541  15 Minute Checks  Location Bedroom  Visual Appearance Calm  Behavior Sleeping  Sleep (Behavioral Health Patients Only)  Calculate sleep? (Click Yes once per 24 hr at 0600 safety check) Yes  Documented sleep last 24 hours 6.5

## 2023-06-09 NOTE — Telephone Encounter (Signed)
 Pharmacy Patient Advocate Encounter  Received notification from [facility] that Prior Authorization for Abilify Maintena 400MG  syringes has been APPROVED from 06/09/2023 to 06/08/2024.   PA #/Case ID/Reference #: [id number]

## 2023-06-09 NOTE — Plan of Care (Signed)
  Problem: Education: Goal: Emotional status will improve Outcome: Progressing   Problem: Activity: Goal: Interest or engagement in activities will improve Outcome: Progressing   Problem: Education: Goal: Emotional status will improve Outcome: Progressing   Problem: Activity: Goal: Interest or engagement in activities will improve Outcome: Progressing

## 2023-06-09 NOTE — Group Note (Signed)
 Date:  06/10/2023 Time:  1:25 AM  Group Topic/Focus:  Wrap-Up Group:   The focus of this group is to help patients review their daily goal of treatment and discuss progress on daily workbooks.    Participation Level:  Active  Participation Quality:  Appropriate and Sharing  Affect:  Appropriate and Flat  Cognitive:  Appropriate  Insight: Appropriate  Engagement in Group:  Engaged  Modes of Intervention:  Activity and Socialization  Additional Comments:  Patients completed wrap up group sheets and shared. Patient rated his day a 9 out of 10 with 10 being the best. Patient shared his goal for today; "get through the day". Patient did achieve goal. Patient shared skills that he finds most help is; "stepping back and listening to music". Patient shared something that he likes about himself; "I have a good amount of knowledge". Patient shared questions/concerns; "Nothing I can do about my concerns! I say that because my neck, back and good and continuing to break apart tooth are al rising in pain and my meds are all going down or disappearing!".  Dillard Frame 06/10/2023, 1:25 AM

## 2023-06-09 NOTE — Group Note (Signed)
 LCSW Group Therapy Note   Group Date: 06/09/2023 Start Time: 1100 End Time: 1200   Participation:  did not attend  Type of Therapy:  Group Therapy  Topic:  Shining from Within:  Confidence and Self-love Journey  Objective:  To support participants in developing confidence and self-love through self-awareness, self-compassion, and practical skills that nurture personal growth.   Group Goals Encourage self-reflection and self-acceptance by identifying personal strengths and achievements. Teach skills to challenge negative self-talk and replace it with supportive, truthful self-talk. Foster resilience and self-worth through [employer], gratitude, and self-care practices.   Summary:  This group explores the connection between confidence and self-love by guiding participants through reflection, mindset shifts, and practical tools like affirmations, strength recognition, and goal-setting. Activities are designed to promote self-compassion, build emotional resilience, and normalize the slow, patient journey of inner growth.   Therapeutic Modalities Used: Elements of Cognitive Behavioral Therapy (CBT): Challenging and reframing unhelpful self-talk. Elements of Motivational Interviewing (MI): Encouraging small, achievable goals. Elements of Dialectical Behavioral Therapist (DBT):  Mindfulness and Self-Compassion: Promoting present-moment awareness and kindness toward self.   Joely Losier O Danel Studzinski, LCSWA 06/09/2023  12:11 PM

## 2023-06-09 NOTE — Plan of Care (Signed)
   Problem: Education: Goal: Emotional status will improve Outcome: Progressing Goal: Mental status will improve Outcome: Progressing   Problem: Activity: Goal: Interest or engagement in activities will improve Outcome: Progressing Goal: Sleeping patterns will improve Outcome: Progressing   Problem: Safety: Goal: Periods of time without injury will increase Outcome: Progressing

## 2023-06-10 ENCOUNTER — Encounter (HOSPITAL_COMMUNITY): Payer: Self-pay

## 2023-06-10 DIAGNOSIS — F3189 Other bipolar disorder: Secondary | ICD-10-CM | POA: Diagnosis not present

## 2023-06-10 DIAGNOSIS — T50905A Adverse effect of unspecified drugs, medicaments and biological substances, initial encounter: Secondary | ICD-10-CM | POA: Diagnosis not present

## 2023-06-10 MED ORDER — CLONIDINE HCL 0.1 MG PO TABS: 0.1000 mg | ORAL_TABLET | ORAL | Status: DC | PRN

## 2023-06-10 NOTE — Progress Notes (Signed)
 [facility] MD Progress Note  06/10/2023 6:36 AM [name] [name]  MRN:  [id number]  Principal Problem: Drug-induced bipolar disorder [facility]) Diagnosis: Principal Problem:   Drug-induced bipolar disorder (HCC) Active Problems:   MDD (major depressive disorder)  Reason for Admission:  [name] [name] is a 53 y.o. male  with a past psychiatric history of MDD, ADHD, PTSD. Patient initially arrived to [facility] on 5/18, and admitted to [facility] under IVC for acute safety concerns and crisis stabalization, feels that his mother is out to get him and has frequent mood swings. PPHx is significant for MDD, and no history of Suicide Attempts, Self Injurious Behavior, or Prior Psychiatric Hospitalizations. PMHx is significant for HTN.   (admitted on 06/05/2023, total  LOS: 5 days )  Chart review: Overnight Events Vital signs: stable  MAR was reviewed and patient was compliant with medications.  Patient received PRN oxycodone  x2 Sleep: 6.75 hours Nursing notes /groups: patient attended 1/2 groups.  COWS 2 for anxiety and irritability  Pertinent information discussed during bed progression:  Case was discussed in the multidisciplinary team. Reports patient seems less pressured and intense compared to yesterday.    Information Obtained Today During Patient Interview: Patient evaluated on the unit. He is less intrusive and perseverative on tooth pain though is still circumstantial today compared to yesterday. He denies issues with sleep or appetite. He reports his mood is "overall fine" and he denies SI/HI/AVH. When asked if he has talked with his parents he reports that he left a voicemail message for them. When asked about his thoughts on his mom, he continues to report that she "just sits there" while his dad does all the work. He denies side effects from abilify. Discussed with patient our suspicion that his prior ADHD medications were contributing to his behavior leading up to admission and he adamantly denies  that he feels this is the case.   Collateral from dad Nasif Bos [phone]:  From dad, he still blames him for getting there. Reports hoping he would turn things around and take some responsibility himself. Seems to be in pain over the tooth. Reports he doesn't seem like he wants to kill them anymore but still blames them for getting him to the hospital. Reports prior to yesterday he was calling parents 4-5 times a day but didn't call as much yesterday. Discussed diagnoses and medications. Asked dad if he could come visit patient and he reports that he may possibly be able to visit tomorrow.   Past Psychiatric History:  History of ADHD, MDD, GAD and PTSD. No past inpatient psychiatric hospitalizations.    Current Psychiatrist: [facility]  Current Therapist: none Previous Psychiatric Diagnoses: MDD, GAD, ADHD, PTSD  Current psychiatric medications: pristiq (not taking due to not helping), adderall Psychiatric medication history/compliance: effexor , gabapentin  300 TID, prozac, paxil, trintellix, vraylar (not taken 2/2 patient concern about side effects) Psychiatric Hospitalization hx: none Psychotherapy hx: denies Neuromodulation history: denies History of suicide (obtained from HPI): denies History of homicide or aggression (obtained in HPI): aggression towards mother prior to admission per IVC   Family Psychiatric History: Reports mother with depression   Social History:  Patient resides with his parents. He is unemployed and on disability.    Living Situation: was living with his parents  Education: reports graduated high school Occupational hx: on disability for back pain Marital Status: single  Children: reports 1 daughter born in 72, reports 1 grandchild  Legal: reports was in jail years ago for drinking and  Ambulance person: denies   Access to firearms: denies though has access to [employer]  Medication stockpile: denies   Past Medical History:  Past Medical  History:  Diagnosis Date   Back pain, chronic    Hypertension    Family History:  Family History  Problem Relation Age of Onset   Cancer Mother    Hypertension Mother    Hypertension Father    Diabetes Father    Diabetes Maternal Uncle     Current Medications: Current Facility-Administered Medications  Medication Dose Route Frequency Provider Last Rate Last Admin   acetaminophen  (TYLENOL ) tablet 650 mg  650 mg Oral Q6H PRN White, Patrice L, NP       alum & mag hydroxide-simeth (MAALOX/MYLANTA) 200-200-20 MG/5ML suspension 30 mL  30 mL Oral Q4H PRN White, Patrice L, NP       ARIPiprazole (ABILIFY) tablet 10 mg  10 mg Oral Daily Nicklaus Alviar, MD   10 mg at 06/09/23 0800   benzocaine (ORAJEL) 10 % mucosal gel   Mouth/Throat QID PRN Stalin Gruenberg, MD   1 Application at 06/09/23 1210   haloperidol  (HALDOL ) tablet 5 mg  5 mg Oral TID PRN White, Patrice L, NP       And   diphenhydrAMINE  (BENADRYL ) capsule 50 mg  50 mg Oral TID PRN White, Patrice L, NP       haloperidol  lactate (HALDOL ) injection 5 mg  5 mg Intramuscular TID PRN White, Patrice L, NP       And   diphenhydrAMINE  (BENADRYL ) injection 50 mg  50 mg Intramuscular TID PRN White, Patrice L, NP       And   LORazepam  (ATIVAN ) injection 2 mg  2 mg Intramuscular TID PRN White, Patrice L, NP       haloperidol  lactate (HALDOL ) injection 10 mg  10 mg Intramuscular TID PRN White, Patrice L, NP       And   diphenhydrAMINE  (BENADRYL ) injection 50 mg  50 mg Intramuscular TID PRN White, Patrice L, NP       And   LORazepam  (ATIVAN ) injection 2 mg  2 mg Intramuscular TID PRN White, Patrice L, NP       hydrochlorothiazide  (HYDRODIURIL ) tablet 25 mg  25 mg Oral Daily White, Patrice L, NP   25 mg at 06/09/23 0800   hydrOXYzine  (ATARAX ) tablet 25 mg  25 mg Oral TID PRN White, Patrice L, NP       lisinopril  (ZESTRIL ) tablet 20 mg  20 mg Oral Daily White, Patrice L, NP   20 mg at 06/09/23 1610   magnesium  hydroxide (MILK OF MAGNESIA)  suspension 30 mL  30 mL Oral Daily PRN White, Patrice L, NP       oxyCODONE  (Oxy IR/ROXICODONE ) immediate release tablet 5 mg  5 mg Oral Q6H PRN Izediuno, Iline Mallory, MD   5 mg at 06/10/23 0002   pregabalin  (LYRICA ) capsule 150 mg  150 mg Oral BID White, Patrice L, NP   150 mg at 06/09/23 1722   traZODone  (DESYREL ) tablet 50 mg  50 mg Oral QHS PRN White, Patrice L, NP        Lab Results: No results found for this or any previous visit (from the past 48 hours).  Blood Alcohol level:  Lab Results  Component Value Date   ETH <15 06/05/2023   ETH (H) 03/28/2010    24        LOWEST DETECTABLE LIMIT FOR SERUM ALCOHOL IS 5 mg/dL FOR MEDICAL  PURPOSES ONLY    Metabolic Labs: Lab Results  Component Value Date   HGBA1C 5.2 06/05/2023   MPG 102.54 06/05/2023   No results found for: "PROLACTIN" Lab Results  Component Value Date   CHOL 173 06/05/2023   TRIG 69 06/05/2023   HDL 43 06/05/2023   CHOLHDL 4.0 06/05/2023   VLDL 14 06/05/2023   LDLCALC 116 (H) 06/05/2023   LDLCALC 96 05/07/2013    Sleep: 6.75 hours  Physical Findings: AIMS: No  CIWA:  CIWA-Ar Total: 4 COWS:  COWS Total Score: 2  Psychiatric Specialty Exam:  Presentation  General Appearance: Appropriate for Environment  Eye Contact:Fair  Speech:Clear and Coherent  Speech Volume:Normal  Handedness:Right   Mood and Affect  Mood: "overall fine"  Affect: flat, Congruent  Thought Process  Thought Processes: Circumstantial  Descriptions of Associations: intact  Orientation:Full (Time, Place and Person)  Thought Content: less prominent delusions regarding mom though still present   History of Schizophrenia/Schizoaffective disorder:No  Duration of Psychotic Symptoms: > 6 months Hallucinations: denies  Ideas of Reference: none noted today  Suicidal Thoughts: Denies Homicidal Thoughts: Denies  Sensorium  Memory:Immediate Fair  Judgment:Poor  Insight:Poor   Executive Functions   Concentration:Fair  Attention Span:Fair  Recall:Fair  Fund of Knowledge:Fair  Language:Fair   Psychomotor Activity  Psychomotor Activity: Normal  Assets  Assets:Communication Skills; Desire for Improvement   Sleep  Sleep: 6 hours   Physical Exam ROS Physical Exam Constitutional:      Appearance: the patient is not toxic-appearing.  Pulmonary:     Effort: Pulmonary effort is normal.  Neurological:     General: No focal deficit present.     Mental Status: the patient is alert and oriented to person, place, and time.   Review of Systems  Respiratory:  Negative for shortness of breath.   Cardiovascular:  Negative for chest pain.  Gastrointestinal:  Negative for abdominal pain, constipation, diarrhea, nausea and vomiting.  Neurological:  Negative for headaches.  MSK: positive for back pain HEENT: positive for tooth pain  Blood pressure 114/87, pulse 64, temperature 97.8 F (36.6 C), temperature source Oral, resp. rate 16, height 6' (1.829 m), weight 85.1 kg, SpO2 100%. Body mass index is 25.44 kg/m.  Treatment Plan Summary: Daily contact with patient to assess and evaluate symptoms and progress in treatment, Medication management, and Plan    ASSESSMENT: DARYLL SPISAK is a 53 y.o. male  with a past psychiatric history of MDD, ADHD, PTSD. Patient initially arrived to [facility] on 5/18, and admitted to [facility] under IVC for acute safety concerns and crisis stabalization, feels that his mother is out to get him and has frequent mood swings. PPHx is significant for MDD, and no history of Suicide Attempts, Self Injurious Behavior, or Prior Psychiatric Hospitalizations. PMHx is significant for HTN.   Patient is less intrusive today compared to yesterday though still appears circumstantial. He denies side effects to abilify, is open to LAI prior to discharge. He has poor insight into the reason for his admission and the suspicion that the high dosage of his ADHD medications  could have been a contributing factor. Family will try to visit with patient tomorrow.   Diagnoses / Active Problems: Substance-induced bipolar 2 disorder, current episode hypomanic (r/o delusional disorder) History of ADHD History of PTSD   PLAN: Safety and Monitoring:  --  INVOLUNTARY admission to inpatient psychiatric unit for safety, stabilization and treatment  -- Daily contact with patient to assess and evaluate symptoms and progress in  treatment  -- Patient's case to be discussed in multi-disciplinary team meeting  -- Observation Level : q15 minute checks  -- Vital signs:  q12 hours  -- Precautions: suicide, elopement, and assault  2. Psychiatric Diagnoses and Treatment:  -- continue abilify 10mg  for paranoia, delusions and additional mood stabilization, consider LAI prior to discharge --continue hydroxyzine  25mg  TID PRN for anxiety --continue trazodone  50mg  at bedtime PRN for insomnia --continue oragel for tooth pain, recommend outpatient follow-up with dentist --continue COWS for irritability associated with opioid withdrawal, started PRN clonidine for patient for opioid withdrawal symptoms              --stop effexor  XR 150mg  for depression given patient currently hypomanic            --stop home adderall for ADHD    -- The risks/benefits/side-effects/alternatives to this medication were discussed in detail with the patient and time was given for questions. The patient consents to medication trial.              -- Metabolic profile and EKG monitoring obtained while on an atypical antipsychotic  BMI: Body mass index is 25.44 kg/m. TSH:  Lab Results  Component Value Date   TSH 2.375 06/05/2023   Lipid Panel:  Lab Results  Component Value Date   CHOL 173 06/05/2023   HDL 43 06/05/2023   LDLCALC 116 (H) 06/05/2023   TRIG 69 06/05/2023   CHOLHDL 4.0 06/05/2023   HbgA1c:  Lab Results  Component Value Date   HGBA1C 5.2 06/05/2023    QTc: 385 (5/17)               -- Encouraged patient to participate in unit milieu and in scheduled group therapies   -- Short Term Goals: Ability to identify changes in lifestyle to reduce recurrence of condition will improve, Ability to verbalize feelings will improve, Ability to demonstrate self-control will improve, Ability to identify and develop effective coping behaviors will improve, Ability to maintain clinical measurements within normal limits will improve, Compliance with prescribed medications will improve, and Ability to identify triggers associated with substance abuse/mental health issues will improve  -- Long Term Goals: Improvement in symptoms so as ready for discharge  Other PRNS:  acetaminophen , alum & mag hydroxide-simeth, benzocaine, haloperidol  **AND** diphenhydrAMINE , haloperidol  lactate **AND** diphenhydrAMINE  **AND** LORazepam , haloperidol  lactate **AND** diphenhydrAMINE  **AND** LORazepam , hydrOXYzine , magnesium  hydroxide, oxyCODONE , traZODone   -- As needed agitation protocol in-place   3. Medical Issues Being Addressed:   #HTN --continue home lisinopril -HCTZ   #Chronic pain Continue home oxy, changed to 10mg  Q6H PRN, plan to decrease to 5mg  Q6H PRN  Continue home lyrica  150 BID   4. Discharge Planning:   -- Social work and case management to assist with discharge planning and identification of hospital follow-up needs prior to discharge  -- Estimated LOS: 5-7 days, possible 5/26-27  -- Discharge Concerns: Need to establish a safety plan; Medication compliance and effectiveness  -- Discharge Goals: Return home with outpatient referrals for mental health follow-up including medication management/psychotherapy   I certify that inpatient services furnished can reasonably be expected to improve the patient's condition.   This note was created using a voice recognition software as a result there may be grammatical errors inadvertently enclosed that do not reflect the nature of this encounter. Every  attempt is made to correct such errors.   This case was discussed with attending Dr. Jerrold Morgan who agrees with the above formulated treatment plan. Please see attending attestation for additional  details.   Dr. Norbert Bean, MD PGY-2, Psychiatry Residency  5/23/20256:36 AM

## 2023-06-10 NOTE — Plan of Care (Signed)
  Problem: Education: Goal: Knowledge of [facility] Education information/materials will improve Outcome: Progressing Goal: Emotional status will improve Outcome: Progressing Goal: Mental status will improve Outcome: Progressing Goal: Verbalization of understanding the information provided will improve Outcome: Progressing   Problem: Activity: Goal: Interest or engagement in activities will improve Outcome: Progressing   Problem: Physical Regulation: Goal: Ability to maintain clinical measurements within normal limits will improve Outcome: Progressing

## 2023-06-10 NOTE — Progress Notes (Addendum)
   06/10/23 1930  Psych Admission Type (Psych Patients Only)  Admission Status Involuntary  Psychosocial Assessment  Patient Complaints Other (Comment) (upset that he is still here and feels he is ready for discharge)  Eye Contact Fair  Facial Expression Anxious  Affect Anxious;Apprehensive  Speech Logical/coherent  Interaction Assertive  Motor Activity Slow  Appearance/Hygiene Unremarkable  Behavior Characteristics Cooperative;Anxious  Mood Depressed;Pleasant  Thought Process  Coherency WDL  Content Preoccupation (wants to be discharged)  Delusions None reported or observed  Perception WDL  Hallucination None reported or observed  Judgment Impaired  Confusion None  Danger to Self  Current suicidal ideation? Denies  Agreement Not to Harm Self Yes  Description of Agreement verbal  Danger to Others  Danger to Others None reported or observed   Progress note   D: Pt seen in his room. Pt denies SI, HI, AVH. Pt rates pain  8/10 as a toothache right upper jaw. States his tooth was broken before he was admitted but it has a sharp edge that is bothering him. "They gave me something to put on it but that doesn't help. I need to get out and get it looked at." Pt rates pain 6/10 as chronic pain in his back. Pt has declined surgery on his neck. Pt rates anxiety  0/10 and depression  0/10. Spoke with pt at length. Pt tearful when speaking about his dog and that no one is caring for him right now. Pt lives with his parents. Said the entire incident that brought him here was a misunderstanding. "The cops took me away in handcuffs. It was embarrassing. I have to go back because I've got no where else to go." Pt states he has asked about discharge but has not received a date from providers. Pt encouraged to continue to pursue the topic if he feels he is ready for discharge. Pt attending groups and active in milieu. No other concerns noted at this time.  A: Pt provided support and encouragement. Pt  given scheduled medication as prescribed. PRNs as appropriate. Q15 min checks for safety.   R: Pt safe on the unit. Will continue to monitor.

## 2023-06-10 NOTE — BHH Group Notes (Signed)
 Adult Psychoeducational Group Note  Date:  06/10/2023 Time:  10:05 PM  Group Topic/Focus:  Wrap-Up Group:   The focus of this group is to help patients review their daily goal of treatment and discuss progress on daily workbooks.  Participation Level:  Active  Participation Quality:  Attentive  Affect:  Appropriate  Cognitive:  Alert  Insight: Improving  Engagement in Group:  Engaged  Modes of Intervention:  Discussion  Additional Comments:  Patient attended and participated in AA group.  [name] [name] Cassandra 06/10/2023, 10:05 PM

## 2023-06-10 NOTE — Group Note (Signed)
 Date:  06/10/2023 Time:  9:05 AM  Group Topic/Focus:  Goals Group:   The focus of this group is to help patients establish daily goals to achieve during treatment and discuss how the patient can incorporate goal setting into their daily lives to aide in recovery.    Participation Level:  Did Not Attend   Ellan Gunner 06/10/2023, 9:05 AM

## 2023-06-10 NOTE — Group Note (Signed)
 Recreation Therapy Group Note   Group Topic:Leisure Education  Group Date: 06/10/2023 Start Time: 0935 End Time: 1005 Facilitators: Leor Whyte-McCall, LRT,CTRS Location: [address] Dayroom   Group Topic: Leisure Education   Goal Area(s) Addresses:  Patient will successfully identify positive leisure and recreation activities.  Patient will acknowledge benefits of participation in healthy leisure activities post discharge.  Patient will actively work with peers toward a shared goal.   Behavioral Response: Appropriate   Intervention: Competitive Group Game    Activity: Pictionary. In groups of 5-7, patients took turns trying to guess the picture being drawn on the board by their teammate.  If the team guessed the correct answer, they won a point.  If the team guessed wrong, the other team got a chance to steal the point. After several rounds of game play, the team with the most points were declared winners. Post-activity discussion reviewed benefits of positive recreation outlets: reducing stress, improving coping mechanisms, increasing self-esteem, and building larger support systems.   Education:  Teacher, English as a foreign language, Leisure as Merchant navy officer, Programmer, applications, Building control surveyor   Education Outcome: Acknowledges education/In group clarification offered/Needs additional education   Affect/Mood: Appropriate   Participation Level: Engaged   Participation Quality: Independent   Behavior: Appropriate   Speech/Thought Process: Focused   Insight: Good   Judgement: Good   Modes of Intervention: Competitive Play   Patient Response to Interventions:  Engaged   Education Outcome:  In group clarification offered    Clinical Observations/Individualized Feedback: Pt attended and participated in group session.      Plan: Continue to engage patient in RT group sessions 2-3x/week.   Shawndale Kilpatrick-McCall, LRT,CTRS 06/10/2023 12:15 PM

## 2023-06-10 NOTE — BH IP Treatment Plan (Signed)
 Interdisciplinary Treatment and Diagnostic Plan Update  06/10/2023 Time of Session: 12:05 PM - UPDATE [name] [name] MRN: [id number]  Principal Diagnosis: Drug-induced bipolar disorder [facility])  Secondary Diagnoses: Principal Problem:   Drug-induced bipolar disorder (HCC) Active Problems:   MDD (major depressive disorder)   Current Medications:  Current Facility-Administered Medications  Medication Dose Route Frequency Provider Last Rate Last Admin   acetaminophen  (TYLENOL ) tablet 650 mg  650 mg Oral Q6H PRN White, Patrice L, NP       alum & mag hydroxide-simeth (MAALOX/MYLANTA) 200-200-20 MG/5ML suspension 30 mL  30 mL Oral Q4H PRN White, Patrice L, NP       ARIPiprazole  (ABILIFY ) tablet 10 mg  10 mg Oral Daily Chien, Stephanie, MD   10 mg at 06/10/23 0747   benzocaine  (ORAJEL) 10 % mucosal gel   Mouth/Throat QID PRN Norbert Bean, MD   1 Application at 06/09/23 1210   cloNIDine  (CATAPRES ) tablet 0.1 mg  0.1 mg Oral Q4H PRN Chien, Stephanie, MD       haloperidol  (HALDOL ) tablet 5 mg  5 mg Oral TID PRN White, Patrice L, NP       And   diphenhydrAMINE  (BENADRYL ) capsule 50 mg  50 mg Oral TID PRN White, Patrice L, NP       haloperidol  lactate (HALDOL ) injection 5 mg  5 mg Intramuscular TID PRN White, Patrice L, NP       And   diphenhydrAMINE  (BENADRYL ) injection 50 mg  50 mg Intramuscular TID PRN White, Patrice L, NP       And   LORazepam  (ATIVAN ) injection 2 mg  2 mg Intramuscular TID PRN White, Patrice L, NP       haloperidol  lactate (HALDOL ) injection 10 mg  10 mg Intramuscular TID PRN White, Patrice L, NP       And   diphenhydrAMINE  (BENADRYL ) injection 50 mg  50 mg Intramuscular TID PRN White, Patrice L, NP       And   LORazepam  (ATIVAN ) injection 2 mg  2 mg Intramuscular TID PRN White, Patrice L, NP       hydrochlorothiazide  (HYDRODIURIL ) tablet 25 mg  25 mg Oral Daily White, Patrice L, NP   25 mg at 06/10/23 0747   hydrOXYzine  (ATARAX ) tablet 25 mg  25 mg Oral TID PRN  White, Patrice L, NP       lisinopril  (ZESTRIL ) tablet 20 mg  20 mg Oral Daily White, Patrice L, NP   20 mg at 06/10/23 6295   magnesium  hydroxide (MILK OF MAGNESIA) suspension 30 mL  30 mL Oral Daily PRN White, Patrice L, NP       oxyCODONE  (Oxy IR/ROXICODONE ) immediate release tablet 5 mg  5 mg Oral Q6H PRN Izediuno, Iline Mallory, MD   5 mg at 06/10/23 1209   pregabalin  (LYRICA ) capsule 150 mg  150 mg Oral BID White, Patrice L, NP   150 mg at 06/10/23 0746   traZODone  (DESYREL ) tablet 50 mg  50 mg Oral QHS PRN White, Patrice L, NP       PTA Medications: Medications Prior to Admission  Medication Sig Dispense Refill Last Dose/Taking   amphetamine -dextroamphetamine  (ADDERALL) 20 MG tablet Take 20 mg by mouth 3 (three) times daily.      ascorbic acid (VITAMIN C) 500 MG tablet Take 500 mg by mouth in the morning and at bedtime.      aspirin EC 81 MG tablet Take 81 mg by mouth every morning.  Cholecalciferol 125 MCG (5000 UT) capsule Take 5,000 Units by mouth daily.      desvenlafaxine (PRISTIQ) 100 MG 24 hr tablet Take 100 mg by mouth daily.      diazepam  (VALIUM ) 5 MG tablet Take 5 mg by mouth 2 (two) times daily as needed.      fluticasone (FLONASE) 50 MCG/ACT nasal spray Place 2 sprays into both nostrils daily.      HYDROcodone -acetaminophen  (NORCO/VICODIN) 5-325 MG tablet Take 1 tablet by mouth every 6 (six) hours as needed for severe pain (pain score 7-10) (as needed for breakthrough pain).      lisinopril -hydrochlorothiazide  (PRINZIDE ,ZESTORETIC ) 20-25 MG per tablet Take 1 tablet by mouth every morning.      Oxycodone  HCl 20 MG TABS Take 1 tablet (20 mg total) by mouth every 4 (four) hours as needed. 10 tablet 0    pregabalin  (LYRICA ) 150 MG capsule Take 150 mg by mouth 2 (two) times daily.      Vitamin D , Ergocalciferol , (DRISDOL ) 50000 UNITS CAPS capsule Take 1 capsule (50,000 Units total) by mouth every 7 (seven) days. 12 capsule 0     Patient Stressors: Marital or family conflict     Patient Strengths: Ability for insight   Treatment Modalities: Medication Management, Group therapy, Case management,  1 to 1 session with clinician, Psychoeducation, Recreational therapy.   Physician Treatment Plan for Primary Diagnosis: Drug-induced bipolar disorder (HCC) Long Term Goal(s): Improvement in symptoms so as ready for discharge   Short Term Goals: Ability to identify changes in lifestyle to reduce recurrence of condition will improve Ability to verbalize feelings will improve Ability to demonstrate self-control will improve Ability to identify and develop effective coping behaviors will improve Ability to maintain clinical measurements within normal limits will improve Compliance with prescribed medications will improve Ability to identify triggers associated with substance abuse/mental health issues will improve  Medication Management: Evaluate patient's response, side effects, and tolerance of medication regimen.  Therapeutic Interventions: 1 to 1 sessions, Unit Group sessions and Medication administration.  Evaluation of Outcomes: Progressing  Physician Treatment Plan for Secondary Diagnosis: Principal Problem:   Drug-induced bipolar disorder (HCC) Active Problems:   MDD (major depressive disorder)  Long Term Goal(s): Improvement in symptoms so as ready for discharge   Short Term Goals: Ability to identify changes in lifestyle to reduce recurrence of condition will improve Ability to verbalize feelings will improve Ability to demonstrate self-control will improve Ability to identify and develop effective coping behaviors will improve Ability to maintain clinical measurements within normal limits will improve Compliance with prescribed medications will improve Ability to identify triggers associated with substance abuse/mental health issues will improve     Medication Management: Evaluate patient's response, side effects, and tolerance of medication  regimen.  Therapeutic Interventions: 1 to 1 sessions, Unit Group sessions and Medication administration.  Evaluation of Outcomes: Progressing   RN Treatment Plan for Primary Diagnosis: Drug-induced bipolar disorder (HCC) Long Term Goal(s): Knowledge of disease and therapeutic regimen to maintain health will improve  Short Term Goals: Ability to remain free from injury will improve, Ability to verbalize frustration and anger appropriately will improve, Ability to verbalize feelings will improve, and Ability to disclose and discuss suicidal ideas  Medication Management: RN will administer medications as ordered by provider, will assess and evaluate patient's response and provide education to patient for prescribed medication. RN will report any adverse and/or side effects to prescribing provider.  Therapeutic Interventions: 1 on 1 counseling sessions, Psychoeducation, Medication administration, Evaluate responses  to treatment, Monitor vital signs and CBGs as ordered, Perform/monitor CIWA, COWS, AIMS and Fall Risk screenings as ordered, Perform wound care treatments as ordered.  Evaluation of Outcomes: Progressing   LCSW Treatment Plan for Primary Diagnosis: Drug-induced bipolar disorder (HCC) Long Term Goal(s): Safe transition to appropriate next level of care at discharge, Engage patient in therapeutic group addressing interpersonal concerns.  Short Term Goals: Engage patient in aftercare planning with referrals and resources, Increase ability to appropriately verbalize feelings, Facilitate acceptance of mental health diagnosis and concerns, and Identify triggers associated with mental health/substance abuse issues  Therapeutic Interventions: Assess for all discharge needs, 1 to 1 time with Social worker, Explore available resources and support systems, Assess for adequacy in community support network, Educate family and significant other(s) on suicide prevention, Complete Psychosocial  Assessment, Interpersonal group therapy.  Evaluation of Outcomes: Progressing   Progress in Treatment: Attending groups: attended some groups Participating in groups: Yes. Taking medication as prescribed: Yes. Toleration medication: Yes. Family/Significant other contact made: Yes, contacted [name] [name] (father) [phone] Patient understands diagnosis: Yes. Discussing patient identified problems/goals with staff: Yes. Medical problems stabilized or resolved: Yes. Denies suicidal/homicidal ideation: Yes. Issues/concerns per patient self-inventory: No.   New problem(s) identified: No, Describe:  None   New Short Term/Long Term Goal(s): detox, medication management for mood stabilization; elimination of SI thoughts; development of comprehensive mental wellness/sobriety plan   Patient Goals:  UTA - patient tangential, manic,    Discharge Plan or Barriers: Patient recently admitted. CSW will continue to follow and assess for appropriate referrals and possible discharge planning.      Reason for Continuation of Hospitalization: Anxiety Mania Medication stabilization Other; describe mood stabilization, discharge planning   Estimated Length of Stay: 4 - 6 days  Last 3 [place] Suicide Severity Risk Score: Flowsheet Row Admission (Current) [facility] from 06/04/2023 in [facility] ED from [facility]/29/2023 in [facility] Emergency Department at [facility]  C-SSRS RISK CATEGORY No Risk No Risk No Risk       Last PHQ 2/9 Scores:    05/07/2013    4:27 PM  Depression screen PHQ 2/9  Decreased Interest 0  Down, Depressed, Hopeless 2  PHQ - 2 Score 2    Scribe for Treatment Team: Detrich Rakestraw O Alexxander Kurt, LCSWA 06/10/2023 4:40 PM

## 2023-06-10 NOTE — Progress Notes (Signed)
   06/09/23 2200  Psych Admission Type (Psych Patients Only)  Admission Status Involuntary  Psychosocial Assessment  Patient Complaints None  Eye Contact Fair  Facial Expression Anxious  Affect Anxious  Speech Logical/coherent  Interaction Assertive;Needy  Motor Activity Slow  Appearance/Hygiene Unremarkable  Behavior Characteristics Restless  Mood Anxious  Thought Process  Coherency WDL  Content Blaming others  Delusions None reported or observed  Perception WDL  Hallucination None reported or observed  Judgment Impaired  Confusion None  Danger to Self  Current suicidal ideation? Denies  Agreement Not to Harm Self Yes  Description of Agreement verbal  Danger to Others  Danger to Others None reported or observed

## 2023-06-11 DIAGNOSIS — F3189 Other bipolar disorder: Secondary | ICD-10-CM | POA: Diagnosis not present

## 2023-06-11 DIAGNOSIS — T50905A Adverse effect of unspecified drugs, medicaments and biological substances, initial encounter: Secondary | ICD-10-CM | POA: Diagnosis not present

## 2023-06-11 NOTE — Group Note (Signed)
 Date:  06/11/2023 Time:  9:50 AM  Group Topic/Focus:  Goals Group:   The focus of this group is to help patients establish daily goals to achieve during treatment and discuss how the patient can incorporate goal setting into their daily lives to aide in recovery. Orientation:   The focus of this group is to educate the patient on the purpose and policies of crisis stabilization and provide a format to answer questions about their admission.  The group details unit policies and expectations of patients while admitted.    Participation Level:  Did Not Attend

## 2023-06-11 NOTE — Progress Notes (Signed)
   06/11/23 0900  Charting Type  Charting Type Shift assessment  Safety Check Verification  Has the RN verified the 15 minute safety check completion? Yes  Neurological  Neuro (WDL) WDL  HEENT  HEENT (WDL) X  Teeth Other (Comment) (Complaining of upper left jaw pain)  Respiratory  Respiratory (WDL) WDL  Cardiac  Cardiac (WDL) WDL  Vascular  Vascular (WDL) WDL  Integumentary  Integumentary (WDL) X (No new changes noted)  Braden Scale (Ages 8 and up)  Sensory Perceptions 4  Moisture 4  Activity 4  Mobility 4  Nutrition 3  Friction and Shear 3  Braden Scale Score 22  Musculoskeletal  Musculoskeletal (WDL) WDL  Assistive Device None  Gastrointestinal  Gastrointestinal (WDL) WDL  GU Assessment  Genitourinary (WDL) WDL  Neurological  Level of Consciousness Alert
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
## 2023-06-11 NOTE — Plan of Care (Signed)
Problem: Education: Goal: Emotional status will improve Outcome: Progressing Goal: Mental status will improve Outcome: Progressing   Problem: Activity: Goal: Interest or engagement in activities will improve Outcome: Progressing Goal: Sleeping patterns will improve Outcome: Progressing   Problem: Health Behavior/Discharge Planning: Goal: Compliance with treatment plan for underlying cause of condition will improve Outcome: Progressing   Problem: Safety: Goal: Periods of time without injury will increase Outcome: Progressing

## 2023-06-11 NOTE — [facility] Group Notes (Signed)
 LCSW Wellness Group Note   06/11/2023 1:00pm  Type of Group and Topic: Psychoeducational Group:  Wellness  Participation Level:  Active  Description of Group  Wellness group introduces the topic and its focus on developing healthy habits across the spectrum and its relationship to a decrease in hospital admissions.  Six areas of wellness are discussed: physical, social spiritual, intellectual, occupational, and emotional.  Patients are asked to consider their current wellness habits and to identify areas of wellness where they are interested and able to focus on improvements.    Therapeutic Goals Patients will understand components of wellness and how they can positively impact overall health.  Patients will identify areas of wellness where they have developed good habits. Patients will identify areas of wellness where they would like to make improvements.    Summary of Patient Progress: pt very active in group discussion and made a number of insightful comments.  Pt identified occupational and social as wellness areas of strength and physical wellness as an area that needs improvement.       Therapeutic Modalities: Cognitive Behavioral Therapy Psychoeducation    Elspeth Hals, LCSW

## 2023-06-11 NOTE — Plan of Care (Signed)
   Problem: Education: Goal: Knowledge of [facility] [facility] Education information/materials will improve Outcome: Progressing Goal: Emotional status will improve Outcome: Progressing Goal: Mental status will improve Outcome: Progressing

## 2023-06-11 NOTE — Plan of Care (Signed)
  Problem: Education: Goal: Emotional status will improve Outcome: Progressing Goal: Verbalization of understanding the information provided will improve Outcome: Progressing   Problem: Activity: Goal: Interest or engagement in activities will improve Outcome: Progressing   

## 2023-06-11 NOTE — [facility] Group Notes (Signed)
 [facility] Group Notes:  (Nursing/MHT/Case Management/Adjunct)  Date:  06/11/2023  Time:  2000  Type of Therapy:  Wrap up group  Participation Level:  Active  Participation Quality:  Appropriate, Attentive, Sharing, and Supportive  Affect:  Appropriate  Cognitive:  Alert  Insight:  Improving  Engagement in Group:  Engaged  Modes of Intervention:  Clarification, Education, and Support  Summary of Progress/Problems: Positive thinking and positive change were discussed.   Catharine Clock 06/11/2023, 9:31 PM

## 2023-06-12 DIAGNOSIS — F3189 Other bipolar disorder: Secondary | ICD-10-CM | POA: Diagnosis not present

## 2023-06-12 DIAGNOSIS — T50905A Adverse effect of unspecified drugs, medicaments and biological substances, initial encounter: Secondary | ICD-10-CM | POA: Diagnosis not present

## 2023-06-12 NOTE — Group Note (Signed)
 Date:  06/12/2023 Time:  5:16 PM  Group Topic/Focus:  Goals Group:   The focus of this group is to help patients establish daily goals to achieve during treatment and discuss how the patient can incorporate goal setting into their daily lives to aide in recovery. Orientation:   The focus of this group is to educate the patient on the purpose and policies of crisis stabilization and provide a format to answer questions about their admission.  The group details unit policies and expectations of patients while admitted.    Participation Level:  Did Not Attend   Sheryl Donna 06/12/2023, 5:16 PM

## 2023-06-12 NOTE — Group Note (Addendum)
 Date:  06/12/2023 Time:  4:39 PM  Group Topic/Focus:   Healthy Communication:   The focus of this group is to discuss boundaries, barriers to setting effective boundaries, as well as healthy ways to communicate them with others.     Participation Level:  Active  Participation Quality:  Inattentive, Redirectable, and Sharing  Affect:  Appropriate  Cognitive:  Appropriate  Insight: Appropriate  Engagement in Group:  Developing/Improving and Distracting  Modes of Intervention:  Discussion and Education  Additional Comments:    Sheryl Donna 06/12/2023, 4:39 PM

## 2023-06-12 NOTE — Progress Notes (Signed)
 [facility] MD Progress Note  06/12/2023 9:45 AM [name] [name]  MRN:  [id number]  Principal Problem: Drug-induced bipolar disorder [facility]) Diagnosis: Principal Problem:   Drug-induced bipolar disorder (HCC) Active Problems:   MDD (major depressive disorder)  Reason for Admission:  [name] [name] is a 53 y.o. male  with a past psychiatric history of MDD, ADHD, PTSD. Patient initially arrived to [facility] on 5/18, and admitted to [facility] under IVC for acute safety concerns and crisis stabalization, feels that his mother is out to get him and has frequent mood swings. PPHx is significant for MDD, and no history of Suicide Attempts, Self Injurious Behavior, or Prior Psychiatric Hospitalizations. PMHx is significant for HTN.   (admitted on 06/05/2023, total  LOS: 7 days )  Chart review: Overnight Events The patient's chart was reviewed and nursing notes were reviewed. Significant vitals signs: stable. The patient's case was discussed in multidisciplinary team meeting. Per [facility], patient was taking medications appropriately . The following as needed medications were given: tylenol , oxycodone  6x. Per nursing, patient is calm and cooperative and attended 1 group session.   Information Obtained Today During Patient Interview: The patient was seen in the milieu, no acute distress. On assessment, the patient feels "good" today. When asked about speaking with family, he reports speaking to his mother and his father visited yesterday. He states that the visit went well.  Patient reports having good sleep, denying issues falling and staying asleep.  Patient reports good appetite. Patient feels that the medications have been helpful with his mood and denies adverse effects.  He reports that he prefers to not be on a long-acting injectable as he has remained compliant on his medications at home.  Patient denies current SI, HI, AVH.   I spoke with patient's mother [name] [name].  She reports that the visit with the  patient's father went well and that the patient now has a different approach when communicating with his father.  She agrees for patient to return home likely tomorrow and denies firearms and illicit substances in the house.  She requests for there to be outpatient resources for psychiatric management as she does not want him to continue going to [facility] medical.  I discussed that I will speak with the social worker regarding placing other outpatient psychiatric resources in the discharge paperwork.  Collateral from dad [name] [name] [phone]:  From dad, he still blames him for getting there. Reports hoping he would turn things around and take some responsibility himself. Seems to be in pain over the tooth. Reports he doesn't seem like he wants to kill them anymore but still blames them for getting him to the hospital. Reports prior to yesterday he was calling parents 4-5 times a day but didn't call as much yesterday. Discussed diagnoses and medications. Asked dad if he could come visit patient and he reports that he may possibly be able to visit tomorrow.   Past Psychiatric History:  History of ADHD, MDD, GAD and PTSD. No past inpatient psychiatric hospitalizations.    Current Psychiatrist: [facility]  Current Therapist: none Previous Psychiatric Diagnoses: MDD, GAD, ADHD, PTSD  Current psychiatric medications: pristiq (not taking due to not helping), adderall Psychiatric medication history/compliance: effexor , gabapentin  300 TID, prozac, paxil, trintellix, vraylar (not taken 2/2 patient concern about side effects) Psychiatric Hospitalization hx: none Psychotherapy hx: denies Neuromodulation history: denies History of suicide (obtained from HPI): denies History of homicide or aggression (obtained in HPI): aggression towards mother prior to admission per IVC  Family Psychiatric History: Reports mother with depression   Social History:  Patient resides with his parents. He is  unemployed and on disability.    Living Situation: was living with his parents  Education: reports graduated high school Occupational hx: on disability for back pain Marital Status: single  Children: reports 1 daughter born in 12, reports 1 grandchild  Legal: reports was in jail years ago for drinking and Ambulance person: denies   Access to firearms: denies though has access to [employer]  Medication stockpile: denies   Past Medical History:  Past Medical History:  Diagnosis Date   Back pain, chronic    Hypertension    Family History:  Family History  Problem Relation Age of Onset   Cancer Mother    Hypertension Mother    Hypertension Father    Diabetes Father    Diabetes Maternal Uncle     Current Medications: Current Facility-Administered Medications  Medication Dose Route Frequency Provider Last Rate Last Admin   acetaminophen  (TYLENOL ) tablet 650 mg  650 mg Oral Q6H PRN White, Patrice L, NP   650 mg at 06/12/23 0635   alum & mag hydroxide-simeth (MAALOX/MYLANTA) 200-200-20 MG/5ML suspension 30 mL  30 mL Oral Q4H PRN White, Patrice L, NP       ARIPiprazole (ABILIFY) tablet 10 mg  10 mg Oral Daily Chien, Stephanie, MD   10 mg at 06/12/23 0759   benzocaine (ORAJEL) 10 % mucosal gel   Mouth/Throat QID PRN Norbert Bean, MD   1 Application at 06/09/23 1210   cloNIDine (CATAPRES) tablet 0.1 mg  0.1 mg Oral Q4H PRN Chien, Stephanie, MD       haloperidol  (HALDOL ) tablet 5 mg  5 mg Oral TID PRN White, Patrice L, NP       And   diphenhydrAMINE  (BENADRYL ) capsule 50 mg  50 mg Oral TID PRN White, Patrice L, NP       haloperidol  lactate (HALDOL ) injection 5 mg  5 mg Intramuscular TID PRN White, Patrice L, NP       And   diphenhydrAMINE  (BENADRYL ) injection 50 mg  50 mg Intramuscular TID PRN White, Patrice L, NP       And   LORazepam  (ATIVAN ) injection 2 mg  2 mg Intramuscular TID PRN White, Patrice L, NP       haloperidol  lactate (HALDOL ) injection 10 mg  10 mg Intramuscular  TID PRN White, Patrice L, NP       And   diphenhydrAMINE  (BENADRYL ) injection 50 mg  50 mg Intramuscular TID PRN White, Patrice L, NP       And   LORazepam  (ATIVAN ) injection 2 mg  2 mg Intramuscular TID PRN White, Patrice L, NP       hydrochlorothiazide  (HYDRODIURIL ) tablet 25 mg  25 mg Oral Daily White, Patrice L, NP   25 mg at 06/12/23 0758   hydrOXYzine  (ATARAX ) tablet 25 mg  25 mg Oral TID PRN White, Patrice L, NP       lisinopril  (ZESTRIL ) tablet 20 mg  20 mg Oral Daily White, Patrice L, NP   20 mg at 06/12/23 0758   magnesium  hydroxide (MILK OF MAGNESIA) suspension 30 mL  30 mL Oral Daily PRN White, Patrice L, NP       oxyCODONE  (Oxy IR/ROXICODONE ) immediate release tablet 5 mg  5 mg Oral Q6H PRN Izediuno, Iline Mallory, MD   5 mg at 06/12/23 9604   pregabalin  (LYRICA ) capsule 150 mg  150 mg Oral BID  White, Patrice L, NP   150 mg at 06/12/23 1308   traZODone  (DESYREL ) tablet 50 mg  50 mg Oral QHS PRN White, Patrice L, NP        Lab Results: No results found for this or any previous visit (from the past 48 hours).  Blood Alcohol level:  Lab Results  Component Value Date   ETH <15 06/05/2023   ETH (H) 03/28/2010    24        LOWEST DETECTABLE LIMIT FOR SERUM ALCOHOL IS 5 mg/dL FOR MEDICAL PURPOSES ONLY    Metabolic Labs: Lab Results  Component Value Date   HGBA1C 5.2 06/05/2023   MPG 102.54 06/05/2023   No results found for: "PROLACTIN" Lab Results  Component Value Date   CHOL 173 06/05/2023   TRIG 69 06/05/2023   HDL 43 06/05/2023   CHOLHDL 4.0 06/05/2023   VLDL 14 06/05/2023   LDLCALC 116 (H) 06/05/2023   LDLCALC 96 05/07/2013    Physical Findings: AIMS: No  CIWA:  CIWA-Ar Total: 0 COWS:  COWS Total Score: 0   Psychiatric Status Exam  Presentation  General Appearance: Appropriate for Environment; Casual; Fairly Groomed   Eye Contact:Good   Speech:Clear and Coherent; Normal Rate   Speech Volume:Normal   Handedness:Right   Mood and Affect   Mood:Euthymic   Affect:Congruent; Appropriate; Full Range    Thought Process  Thought Processes:Coherent; Goal Directed; Linear   Past Diagnosis of Schizophrenia or Psychoactive disorder: No  Descriptions of Associations:Intact   Orientation:Full (Time, Place and Person)   Thought Content:Logical   Hallucinations:Hallucinations: None   Ideas of Reference:None   Suicidal Thoughts:Suicidal Thoughts: No   Homicidal Thoughts:Homicidal Thoughts: No    Sensorium  Memory:Immediate Good; Recent Good; Remote Good   Judgment:Fair   Insight:Fair    Executive Functions  Concentration:Good   Attention Span:Good   Recall:Good   Fund of Knowledge:Good   Language:Good    Psychomotor Activity  Psychomotor Activity:Psychomotor Activity: Normal    Assets  Assets:Communication Skills; Desire for Improvement; Resilience    Physical Exam Vitals reviewed.  Constitutional:      Appearance: Normal appearance.  HENT:     Head: Normocephalic and atraumatic.  Cardiovascular:     Rate and Rhythm: Normal rate.  Pulmonary:     Effort: Pulmonary effort is normal.  Musculoskeletal:        General: Normal range of motion.  Neurological:     General: No focal deficit present.     Mental Status: He is alert.    Review of Systems  Constitutional:  Negative for chills and fever.  HENT:         Tooth ache  Respiratory:  Negative for shortness of breath.   Cardiovascular:  Negative for chest pain and palpitations.  Gastrointestinal:  Negative for nausea and vomiting.  Neurological:  Negative for headaches.     Blood pressure 106/69, pulse 78, temperature 97.9 F (36.6 C), temperature source Oral, resp. rate 16, height 6' (1.829 m), weight 85.1 kg, SpO2 100%. Body mass index is 25.44 kg/m.  Treatment Plan Summary: Daily contact with patient to assess and evaluate symptoms and progress in treatment, Medication management, and Plan     ASSESSMENT: [name] [name] is a 53 y.o. male  with a past psychiatric history of MDD, ADHD, PTSD. Patient initially arrived to [facility] on 5/18, and admitted to [facility] under IVC for acute safety concerns and crisis stabalization, feels that his mother is out to get him and  has frequent mood swings. PPHx is significant for MDD, and no history of Suicide Attempts, Self Injurious Behavior, or Prior Psychiatric Hospitalizations. PMHx is significant for HTN.   Patient continues to be calm and cooperative.  He states that his visit with his father went well and this confirmed with patient's mother and collateral call.  Safety planning was completed and I spoke with the social worker regarding placing other outpatient psychiatric resources in the discharge paperwork.  Plan for patient to be discharged tomorrow so long as patient remains stable.  Diagnoses / Active Problems: Substance-induced bipolar 2 disorder, current episode hypomanic (r/o delusional disorder) History of ADHD History of PTSD   PLAN: Safety and Monitoring:  --  INVOLUNTARY admission to inpatient psychiatric unit for safety, stabilization and treatment  -- Daily contact with patient to assess and evaluate symptoms and progress in treatment  -- Patient's case to be discussed in multi-disciplinary team meeting  -- Observation Level : q15 minute checks  -- Vital signs:  q12 hours  -- Precautions: suicide, elopement, and assault  2. Psychiatric Diagnoses and Treatment:  -- continue abilify 10mg  for paranoia, delusions and additional mood stabilization --continue hydroxyzine  25mg  TID PRN for anxiety --continue trazodone  50mg  at bedtime PRN for insomnia  ---D/c COWS as scores have been <4 for >24 hours              --stopped effexor  XR 150mg  for depression given patient currently hypomanic            --stopped home adderall for ADHD    -- The risks/benefits/side-effects/alternatives to this medication were discussed in detail  with the patient and time was given for questions. The patient consents to medication trial.              -- Metabolic profile and EKG monitoring obtained while on an atypical antipsychotic  BMI: Body mass index is 25.44 kg/m. TSH:  Lab Results  Component Value Date   TSH 2.375 06/05/2023   Lipid Panel:  Lab Results  Component Value Date   CHOL 173 06/05/2023   HDL 43 06/05/2023   LDLCALC 116 (H) 06/05/2023   TRIG 69 06/05/2023   CHOLHDL 4.0 06/05/2023   HbgA1c:  Lab Results  Component Value Date   HGBA1C 5.2 06/05/2023    QTc: 385 (5/17)              -- Encouraged patient to participate in unit milieu and in scheduled group therapies   -- Short Term Goals: Ability to identify changes in lifestyle to reduce recurrence of condition will improve, Ability to verbalize feelings will improve, Ability to demonstrate self-control will improve, Ability to identify and develop effective coping behaviors will improve, Ability to maintain clinical measurements within normal limits will improve, Compliance with prescribed medications will improve, and Ability to identify triggers associated with substance abuse/mental health issues will improve  -- Long Term Goals: Improvement in symptoms so as ready for discharge  Other PRNS:  acetaminophen , alum & mag hydroxide-simeth, benzocaine, cloNIDine, haloperidol  **AND** diphenhydrAMINE , haloperidol  lactate **AND** diphenhydrAMINE  **AND** LORazepam , haloperidol  lactate **AND** diphenhydrAMINE  **AND** LORazepam , hydrOXYzine , magnesium  hydroxide, oxyCODONE , traZODone   -- As needed agitation protocol in-place   3. Medical Issues Being Addressed:   #Tooth pain --continue oragel for tooth pain, recommend outpatient follow-up with dentist --on oxycodone  5 mg q6 PRN  #HTN --continue home lisinopril -HCTZ   #Chronic pain Continue home oxy, changed to 10mg  Q6H PRN, plan to decrease to 5mg  Q6H PRN  Continue home lyrica  150  BID   4. Discharge Planning:    -- Social work and case management to assist with discharge planning and identification of hospital follow-up needs prior to discharge  -- Estimated LOS: 5-7 days, possible 5/26-27  -- Discharge Concerns: Need to establish a safety plan; Medication compliance and effectiveness  -- Discharge Goals: Return home with outpatient referrals for mental health follow-up including medication management/psychotherapy   Saratha Cunas, MD PGY-2 Psychiatry 5/25/20259:45 AM

## 2023-06-12 NOTE — Progress Notes (Addendum)
 D. Pt has been calm and cooperative,  friendly during interactions- visible in the milieu, observed attending groups. Pt reported sleeping well last night, described his appetite and concentration as 'good', and energy level as 'normal'. Pt reported that he was ready to be discharged,and that his parents were eager for him to come home. Pt currently denies SI/HI and AVH and doesn't appear to be responding to internal stimuli. A. Labs and vitals monitored. Pt given and educated on medications. Pt supported emotionally and encouraged to express concerns and ask questions.   R. Pt remains safe with 15 minute checks. Will continue POC.    06/12/23 1000  Psych Admission Type (Psych Patients Only)  Admission Status Involuntary  Psychosocial Assessment  Patient Complaints None  Eye Contact Fair  Facial Expression Anxious  Affect Appropriate to circumstance  Speech Logical/coherent  Interaction Assertive  Motor Activity Fidgety  Appearance/Hygiene Unremarkable  Behavior Characteristics Cooperative  Mood Anxious;Pleasant  Thought Process  Coherency WDL  Content WDL  Delusions None reported or observed  Perception WDL  Hallucination None reported or observed  Judgment WDL  Confusion None  Danger to Self  Current suicidal ideation? Denies  Danger to Others  Danger to Others None reported or observed

## 2023-06-12 NOTE — Plan of Care (Signed)
   Problem: Education: Goal: Emotional status will improve Outcome: Progressing Goal: Mental status will improve Outcome: Progressing   Problem: Activity: Goal: Interest or engagement in activities will improve Outcome: Progressing

## 2023-06-12 NOTE — Progress Notes (Signed)
 Pt visible in the milieu.  Interacting appropriately with staff and peers.  Attending groups.  Pt reported his mother told him he is to be d/c tomorrow.  Reported anxiety and depression 0/10.  Needs assessed.  Pt denied.  Pt denied SI, HI and AVH.  Safety checks continue for patient safety.  Pt safe on unit.

## 2023-06-13 MED ORDER — ARIPIPRAZOLE 10 MG PO TABS: 10.0000 mg | ORAL_TABLET | Freq: Every day | ORAL | 0 refills | Status: DC

## 2023-06-13 NOTE — Discharge Summary (Signed)
 Physician Discharge Summary Note  Patient:  [name] [name] is an 53 y.o., male MRN:  [id number] DOB:  1971/01/04 Patient phone:  [phone] (home)  Patient address:   [address] [place] [facility] [address],   Date of Admission:  06/05/2023 Date of Discharge: 06/13/2023  Reason for Admission:  [name] [name] is a 53 y.o. male  with a past psychiatric history of MDD, ADHD, PTSD. Patient initially arrived to [facility] on 5/18, and admitted to [facility] under IVC for acute safety concerns and crisis stabalization, feels that his mother is out to get him and has frequent mood swings. PPHx is significant for MDD, and no history of Suicide Attempts, Self Injurious Behavior, or Prior Psychiatric Hospitalizations. PMHx is significant for HTN.   Principal Problem: Drug-induced bipolar disorder [facility]) Discharge Diagnoses: Principal Problem:   Drug-induced bipolar disorder (HCC)  Past Psychiatric History:  History of ADHD, MDD, GAD and PTSD. No past inpatient psychiatric hospitalizations.    Current Psychiatrist: [facility]  Current Therapist: none Previous Psychiatric Diagnoses: MDD, GAD, ADHD, PTSD  Current psychiatric medications: pristiq (not taking due to not helping), adderall Psychiatric medication history/compliance: effexor , gabapentin  300 TID, prozac, paxil, trintellix, vraylar (not taken 2/2 patient concern about side effects) Psychiatric Hospitalization hx: none Psychotherapy hx: denies Neuromodulation history: denies History of suicide (obtained from HPI): denies History of homicide or aggression (obtained in HPI): aggression towards mother prior to admission per IVC   Past Medical History:  Past Medical History:  Diagnosis Date   Back pain, chronic    Hypertension     Past Surgical History:  Procedure Laterality Date   COTTON OSTEOTOMY WITH GRAFT Left 05/25/2012   @ [facility]   HAND SURGERY     KNEE ARTHROSCOPY     TONSILLECTOMY     Family History:  Family History   Problem Relation Age of Onset   Cancer Mother    Hypertension Mother    Hypertension Father    Diabetes Father    Diabetes Maternal Uncle    Family Psychiatric  History:  Reports mother with depression   Social History:  Social History   Substance and Sexual Activity  Alcohol Use No   Comment: occasionally     Social History   Substance and Sexual Activity  Drug Use No    Social History   Socioeconomic History   Marital status: Single    Spouse name: Not on file   Number of children: Not on file   Years of education: Not on file   Highest education level: Not on file  Occupational History   Not on file  Tobacco Use   Smoking status: Never   Smokeless tobacco: Never  Vaping Use   Vaping status: Not on file  Substance and Sexual Activity   Alcohol use: No    Comment: occasionally   Drug use: No   Sexual activity: Not on file  Other Topics Concern   Not on file  Social History Narrative   Not on file   Social Drivers of Health   Financial Resource Strain: Not on file  Food Insecurity: No Food Insecurity (06/05/2023)   Hunger Vital Sign    Worried About Running Out of Food in the Last Year: Never true    Ran Out of Food in the Last Year: Never true  Transportation Needs: No Transportation Needs (06/05/2023)   PRAPARE - Administrator, Civil Service (Medical): No    Lack of Transportation (Non-Medical): No  Physical Activity: Not on file  Stress: Not on file  Social Connections: Not on file    Hospital Course:   During the patient's hospitalization, patient had extensive initial psychiatric evaluation, and follow-up psychiatric evaluations every day.   Psychiatric diagnoses provided upon initial assessment:  Substance-induced bipolar 2 disorder, current episode hypomanic (r/o delusional disorder) History of ADHD History of PTSD    Patient's psychiatric medications were adjusted on admission:  -recommended to patient starting abilify 5mg   for paranoia and additional mood stabilization, patient declined at this time.  --start hydroxyzine  25mg  TID PRN for anxiety --start trazodone  50mg  at bedtime PRN for insomnia --stop effexor  XR 150mg  for depression given patient currently hypomanic --stop home adderall for ADHD    During the hospitalization, other adjustments were made to the patient's psychiatric medication regimen:  -started abilify and increased to 10mg  for paranoia, mood stabilization   Patient's care was discussed during the interdisciplinary team meeting every day during the hospitalization.   The patient denied having side effects to prescribed psychiatric medication.   Gradually, patient started adjusting to milieu. The patient was evaluated each day by a clinical provider to ascertain response to treatment. Improvement was noted by the patient's report of decreasing symptoms, improved sleep and appetite, affect, medication tolerance, behavior, and participation in unit programming.  Patient was asked each day to complete a self inventory noting mood, mental status, pain, new symptoms, anxiety and concerns.     Symptoms were reported as significantly decreased or resolved completely by discharge.    On day of discharge, the patient reports that their mood is stable. The patient denied having suicidal thoughts for more than 48 hours prior to discharge.  Patient denies having homicidal thoughts.  Patient denies having auditory hallucinations.  Patient denies any visual hallucinations or other symptoms of psychosis. The patient was motivated to continue taking medication with a goal of continued improvement in mental health.    The patient reports their target psychiatric symptoms of mania responded well to the psychiatric medications, and the patient reports overall benefit other psychiatric hospitalization. Supportive psychotherapy was provided to the patient. The patient also participated in regular group therapy while  hospitalized. Coping skills, problem solving as well as relaxation therapies were also part of the unit programming.   Labs were reviewed with the patient, and abnormal results were discussed with the patient.   The patient is able to verbalize their individual safety plan to this provider.  Behavioral Events: none  # It is recommended to the patient to continue psychiatric medications as prescribed, after discharge from the hospital.    # It is recommended to the patient to follow up with your outpatient psychiatric provider and PCP.  # It was discussed with the patient, the impact of alcohol, drugs, tobacco have been there overall psychiatric and medical wellbeing, and total abstinence from substance use was recommended to the patient.  # Prescriptions provided or sent directly to preferred pharmacy at discharge. Patient agreeable to plan. Given opportunity to ask questions. Appears to feel comfortable with discharge.    # In the event of worsening symptoms, the patient is instructed to call the crisis hotline, 911 and or go to the nearest ED for appropriate evaluation and treatment of symptoms. To follow-up with primary care provider for other medical issues, concerns and or health care needs  # Patient was discharged home with a plan to follow up as noted below.   Physical Findings: AIMS:  , ,  ,  ,  CIWA:  CIWA-Ar Total: 0 COWS:  COWS Total Score: 0  Musculoskeletal: Strength & Muscle Tone: within normal limits Gait & Station: normal Patient leans: N/A  Psychiatric Specialty Exam General Appearance: appears at stated age, casually dressed and groomed    Behavior: pleasant and cooperative    Psychomotor Activity: no psychomotor agitation or retardation noted    Eye Contact: fair  Speech: normal amount, volume and fluency    Mood: euthymic  Affect: congruent, pleasant and interactive    Thought Process: linear, goal directed, somewhat circumstantial, no racing  thoughts or flight of ideas  Descriptions of Associations: intact    Thought Content Hallucinations: denies AH, VH , does not appear responding to stimuli  Delusions: no paranoia, delusions of control, grandeur, ideas of reference, thought broadcasting, and magical thinking  Suicidal Thoughts: denies SI, intention, plan  Homicidal Thoughts: denies HI, intention, plan    Alertness/Orientation: alert and fully oriented    Insight: limited Judgment: limited, improving   Memory: intact    Executive Functions  Concentration: intact  Attention Span: fair  Recall: intact  Fund of Knowledge: fair   Physical Exam ROS Physical Exam Constitutional:      Appearance: the patient is not toxic-appearing.  Pulmonary:     Effort: Pulmonary effort is normal.  Neurological:     General: No focal deficit present.     Mental Status: the patient is alert and oriented to person, place, and time.   Review of Systems  Respiratory:  Negative for shortness of breath.   Cardiovascular:  Negative for chest pain.  Gastrointestinal:  Negative for abdominal pain, constipation, diarrhea, nausea and vomiting.  Neurological:  Negative for headaches.   Blood pressure 112/80, pulse 73, temperature (!) 97.5 F (36.4 C), resp. rate 16, height 6' (1.829 m), weight 85.1 kg, SpO2 97%. Body mass index is 25.44 kg/m.  Social History   Tobacco Use  Smoking Status Never  Smokeless Tobacco Never   Tobacco Cessation:  N/A, patient does not currently use tobacco products  Blood Alcohol level:  Lab Results  Component Value Date   ETH <15 06/05/2023   ETH (H) 03/28/2010    24        LOWEST DETECTABLE LIMIT FOR SERUM ALCOHOL IS 5 mg/dL FOR MEDICAL PURPOSES ONLY    Metabolic Disorder Labs:  Lab Results  Component Value Date   HGBA1C 5.2 06/05/2023   MPG 102.54 06/05/2023   No results found for: "PROLACTIN" Lab Results  Component Value Date   CHOL 173 06/05/2023   TRIG 69 06/05/2023   HDL 43  06/05/2023   CHOLHDL 4.0 06/05/2023   VLDL 14 06/05/2023   LDLCALC 116 (H) 06/05/2023   LDLCALC 96 05/07/2013    Discharge destination:  Home  Is patient on multiple antipsychotic therapies at discharge:  No   Has Patient had three or more failed trials of antipsychotic monotherapy by history:  No  Recommended Plan for Multiple Antipsychotic Therapies: NA  Discharge Instructions     Call MD for:  difficulty breathing, headache or visual disturbances   Complete by: As directed    Call MD for:  extreme fatigue   Complete by: As directed    Call MD for:  hives   Complete by: As directed    Call MD for:  persistant dizziness or light-headedness   Complete by: As directed    Call MD for:  persistant nausea and vomiting   Complete by: As directed    Call MD  for:  severe uncontrolled pain   Complete by: As directed    Call MD for:  temperature >100.4   Complete by: As directed    Diet - low sodium heart healthy   Complete by: As directed    Increase activity slowly   Complete by: As directed       Allergies as of 06/13/2023       Reactions   Ibuprofen  Other (See Comments)   Makes g.i. Upset and stomach burns   Ketorolac    Caused severe pressure in bladder and back with injections   Nsaids Other (See Comments)   GI upset stomach burns   Ultram [tramadol Hcl] Other (See Comments)   Stomach cramps        Medication List     STOP taking these medications    amphetamine -dextroamphetamine  20 MG tablet Commonly known as: ADDERALL   aspirin EC 81 MG tablet   desvenlafaxine 100 MG 24 hr tablet Commonly known as: PRISTIQ   diazepam  5 MG tablet Commonly known as: VALIUM    HYDROcodone -acetaminophen  5-325 MG tablet Commonly known as: NORCO/VICODIN   Oxycodone  HCl 20 MG Tabs       TAKE these medications      Indication  ARIPiprazole 10 MG tablet Commonly known as: ABILIFY Take 1 tablet (10 mg total) by mouth daily.  Indication: Manic Phase of  Manic-Depression   ascorbic acid 500 MG tablet Commonly known as: VITAMIN C Take 500 mg by mouth in the morning and at bedtime.  Indication: nutrition   Cholecalciferol 125 MCG (5000 UT) capsule Take 5,000 Units by mouth daily.  Indication: nutrition   fluticasone 50 MCG/ACT nasal spray Commonly known as: FLONASE Place 2 sprays into both nostrils daily.  Indication: Allergic Rhinitis   lisinopril -hydrochlorothiazide  20-25 MG tablet Commonly known as: ZESTORETIC  Take 1 tablet by mouth every morning.  Indication: High Blood Pressure   pregabalin  150 MG capsule Commonly known as: LYRICA  Take 150 mg by mouth 2 (two) times daily.  Indication: Neuropathic Pain   Vitamin D  (Ergocalciferol ) 1.25 MG (50000 UNIT) Caps capsule Commonly known as: DRISDOL  Take 1 capsule (50,000 Units total) by mouth every 7 (seven) days.  Indication: Vitamin D  Deficiency        Follow-up Information     [place] Go on 06/14/2023.   Specialty: Professional Counselor Why: Please go to this provider on 06/14/23 at 9:00 am for an assessment, to receive therapy services. You may also go on Monday through Friday, from 9 am to 1 pm for an assessment. Contact information: [address]  [address] [place] [address] [phone]         [facility]. Go on 06/18/2023.   Why: You have an appointment for medication management services on Saturday, 06/18/23 at 10:15 am with Ricarda Challenger.  P: [phone] Contact information: [address] [place] [address] [phone]         [facility] Follow up.   Why: You may also call this provider for medication management services. Contact information: [address] [place] [address] [phone]                 Discharge recommendations:  Activity: as tolerated   Diet: heart healthy   # It is recommended to the patient to continue psychiatric medications as prescribed,  after discharge from the hospital.     # It is recommended to the patient to follow up with your outpatient  psychiatric provider -instructions on appointment date, time, and address (location) are provided to you in discharge paperwork   # Follow-up with outpatient primary care doctor and other specialists -for management of chronic medical disease, including:  -HTN, chronic pain   # Testing: Follow-up with outpatient provider for abnormal lab results:  -LDL 116   # It was discussed with the patient, the impact of alcohol, drugs, tobacco have been there overall psychiatric and medical wellbeing, and total abstinence from substance use was recommended to the patient.   # Prescriptions provided or sent directly to preferred pharmacy at discharge. Patient agreeable to plan. Given opportunity to ask questions. Appears to feel comfortable with discharge.    # In the event of worsening symptoms, the patient is instructed to call the crisis hotline, 911 and or go to the nearest ED for appropriate evaluation and treatment of symptoms. To follow-up with primary care provider for other medical issues, concerns and or health care needs  Patient agrees with D/C instructions and plan.   This case was discussed with attending Dr. Daneil Dunker who agrees with the above formulated treatment plan. Please see attending attestation for additional details.   This note was created using a voice recognition software as a result there may be grammatical errors inadvertently enclosed that do not reflect the nature of this encounter. Every attempt is made to correct such errors.   Signed: Norbert Bean, MD, PGY-2 06/13/2023, 9:42 AM

## 2023-06-13 NOTE — Discharge Instructions (Signed)

## 2023-06-13 NOTE — Progress Notes (Signed)
   06/13/23 0900  Psych Admission Type (Psych Patients Only)  Admission Status Involuntary  Psychosocial Assessment  Patient Complaints None  Eye Contact Fair  Facial Expression Anxious  Affect Appropriate to circumstance  Speech Logical/coherent  Interaction Assertive  Motor Activity Other (Comment) (steady gait)  Appearance/Hygiene Unremarkable  Behavior Characteristics Cooperative  Mood Pleasant  Thought Process  Coherency WDL  Content WDL  Delusions None reported or observed  Perception WDL  Hallucination None reported or observed  Judgment WDL  Confusion None  Danger to Self  Current suicidal ideation? Denies  Danger to Others  Danger to Others None reported or observed

## 2023-06-13 NOTE — Progress Notes (Signed)
  [facility] Adult Case Management Discharge Plan :  Will you be returning to the same living situation after discharge:  Yes,  pt returning home  At discharge, do you have transportation home?: Yes,  pt being picked up at 1000 Do you have the ability to pay for your medications: Yes,  pt has active health insurance coverage  Release of information consent forms completed and in the chart;  Patient's signature needed at discharge.  Patient to Follow up at:  Follow-up Information     [facility] Go on 06/14/2023.   Specialty: Professional Counselor Why: Please go to this provider on 06/14/23 at 9:00 am for an assessment, to receive therapy services. You may also go on Monday through Friday, from 9 am to 1 pm for an assessment. Contact information: [address]  [address] [place] [address] [phone]         [facility]. Go on 06/18/2023.   Why: You have an appointment for medication management services on Saturday, 06/18/23 at 10:15 am with Ricarda Challenger.  P: [phone] Contact information: [address] [place] [address] [phone]         [facility] Follow up.   Why: You may also call this provider for medication management services. Contact information: [address] [place] [address] [phone]                 Next level of care provider has access to [facility] Link:no  Safety Planning and Suicide Prevention discussed: Yes,  Shulem Mader (Father) [phone]     Has patient been referred to the Quitline?: Patient does not use tobacco/nicotine products  Patient has been referred for addiction treatment: No known substance use disorder.  Vonzell Guerin, LCSWA 06/13/2023, 9:32 AM

## 2023-06-13 NOTE — BHH Suicide Risk Assessment (Signed)
 [facility] Discharge Suicide Risk Assessment  Principal Problem: Drug-induced bipolar disorder [facility]) Discharge Diagnoses: Principal Problem:   Drug-induced bipolar disorder [facility])  Reason for admission: [name] [name] is a 53 y.o. male  with a past psychiatric history of MDD, ADHD, PTSD. Patient initially arrived to [facility] on 5/18, and admitted to [facility] under IVC for acute safety concerns and crisis stabalization, feels that his mother is out to get him and has frequent mood swings. PPHx is significant for MDD, and no history of Suicide Attempts, Self Injurious Behavior, or Prior Psychiatric Hospitalizations. PMHx is significant for HTN.   Hospital Course:   During the patient's hospitalization, patient had extensive initial psychiatric evaluation, and follow-up psychiatric evaluations every day.  Psychiatric diagnoses provided upon initial assessment:  Substance-induced bipolar 2 disorder, current episode hypomanic (r/o delusional disorder) History of ADHD History of PTSD   Patient's psychiatric medications were adjusted on admission:  -recommended to patient starting abilify 5mg  for paranoia and additional mood stabilization, patient declined at this time.  --start hydroxyzine  25mg  TID PRN for anxiety --start trazodone  50mg  at bedtime PRN for insomnia --stop effexor  XR 150mg  for depression given patient currently hypomanic --stop home adderall for ADHD   During the hospitalization, other adjustments were made to the patient's psychiatric medication regimen:  -started abilify and increased to 10mg  for paranoia, mood stabilization  Patient's care was discussed during the interdisciplinary team meeting every day during the hospitalization.  The patient denied having side effects to prescribed psychiatric medication.  Gradually, patient started adjusting to milieu. The patient was evaluated each day by a clinical provider to ascertain response to treatment. Improvement was noted by the  patient's report of decreasing symptoms, improved sleep and appetite, affect, medication tolerance, behavior, and participation in unit programming.  Patient was asked each day to complete a self inventory noting mood, mental status, pain, new symptoms, anxiety and concerns.    Symptoms were reported as significantly decreased or resolved completely by discharge.   On day of discharge, the patient reports that their mood is stable. The patient denied having suicidal thoughts for more than 48 hours prior to discharge.  Patient denies having homicidal thoughts.  Patient denies having auditory hallucinations.  Patient denies any visual hallucinations or other symptoms of psychosis. The patient was motivated to continue taking medication with a goal of continued improvement in mental health.   The patient reports their target psychiatric symptoms of mania responded well to the psychiatric medications, and the patient reports overall benefit other psychiatric hospitalization. Supportive psychotherapy was provided to the patient. The patient also participated in regular group therapy while hospitalized. Coping skills, problem solving as well as relaxation therapies were also part of the unit programming.  Labs were reviewed with the patient, and abnormal results were discussed with the patient.  The patient is able to verbalize their individual safety plan to this provider.  Behavioral Events: none  Sleep  Sleep:Sleep: Fair   Musculoskeletal: Strength & Muscle Tone: within normal limits Gait & Station: normal Patient leans: N/A  Mental Status Exam  General Appearance: appears at stated age, casually dressed and groomed   Behavior: pleasant and cooperative   Psychomotor Activity: no psychomotor agitation or retardation noted   Eye Contact: fair  Speech: normal amount, volume and fluency   Mood: euthymic  Affect: congruent, pleasant and interactive   Thought Process: linear, goal  directed, somewhat circumstantial, no racing thoughts or flight of ideas  Descriptions of Associations: intact   Thought Content Hallucinations:  denies AH, VH , does not appear responding to stimuli  Delusions: no paranoia, delusions of control, grandeur, ideas of reference, thought broadcasting, and magical thinking  Suicidal Thoughts: denies SI, intention, plan  Homicidal Thoughts: denies HI, intention, plan   Alertness/Orientation: alert and fully oriented   Insight: limited Judgment: limited, improving  Memory: intact   Executive Functions  Concentration: intact  Attention Span: fair  Recall: intact  Fund of Knowledge: fair   Sleep  Sleep: Sleep: Fair  Assets  Assets: Manufacturing systems engineer; Desire for Improvement; Resilience   Physical Exam ROS Physical Exam Constitutional:      Appearance: the patient is not toxic-appearing.  Pulmonary:     Effort: Pulmonary effort is normal.  Neurological:     General: No focal deficit present.     Mental Status: the patient is alert and oriented to person, place, and time.   Review of Systems  Respiratory:  Negative for shortness of breath.   Cardiovascular:  Negative for chest pain.  Gastrointestinal:  Negative for abdominal pain, constipation, diarrhea, nausea and vomiting.  Neurological:  Negative for headaches.  HEENT: positive for tooth pain  Blood pressure 112/80, pulse 73, temperature (!) 97.5 F (36.4 C), resp. rate 16, height 6' (1.829 m), weight 85.1 kg, SpO2 97%. Body mass index is 25.44 kg/m.  Mental Status Per Nursing Assessment::   On Admission:  NA  Demographic Factors:  Male and Low socioeconomic status  Loss Factors: Loss of significant relationship  Historical Factors: Impulsivity  Risk Reduction Factors:   Living with another person, especially a relative and Positive social support  Continued Clinical Symptoms:  Alcohol/Substance Abuse/Dependencies  Cognitive Features That Contribute To  Risk:  Thought constriction (tunnel vision)    Suicide Risk:  Mild: There are no identifiable plans, no associated intent, mild dysphoria and related symptoms, good self-control (both objective and subjective assessment), few other risk factors, and identifiable protective factors, including available and accessible social support.    Follow-up Information     [name] Go on 06/14/2023.   Specialty: Professional Counselor Why: Please go to this provider on 06/14/23 at 9:00 am for an assessment, to receive therapy services. You may also go on Monday through Friday, from 9 am to 1 pm for an assessment. Contact information: [address] [place]  [address] [place] [address] [phone]         [facility]. Go on 06/18/2023.   Why: You have an appointment for medication management services on Saturday, 06/18/23 at 10:15 am with Ricarda Challenger.  P: [phone] Contact information: [address] [place] [address] [phone]         [facility] Follow up.   Why: You may also call this provider for medication management services. Contact information: [address] [place] [address] [phone]                 Discharge recommendations:    Activity: as tolerated  Diet: heart healthy  # It is recommended to the patient to continue psychiatric medications as prescribed, after discharge from the hospital.     # It is recommended to the patient to follow up with your outpatient psychiatric provider -instructions on appointment date, time, and address (location) are provided to you in discharge paperwork  # Follow-up with outpatient primary care doctor and other specialists -for management of chronic medical disease, including:  -HTN, chronic pain  # Testing: Follow-up with outpatient provider for abnormal  lab results:  -LDL 116   # It was discussed with the patient, the impact of alcohol, drugs, tobacco  have been there overall psychiatric and medical wellbeing, and total abstinence from substance use was recommended to the patient.   # Prescriptions provided or sent directly to preferred pharmacy at discharge. Patient agreeable to plan. Given opportunity to ask questions. Appears to feel comfortable with discharge.    # In the event of worsening symptoms, the patient is instructed to call the crisis hotline, 911 and or go to the nearest ED for appropriate evaluation and treatment of symptoms. To follow-up with primary care provider for other medical issues, concerns and or health care needs  Patient agrees with D/C instructions and plan.   This case was discussed with attending Dr. Daneil Dunker who agrees with the above formulated treatment plan. Please see attending attestation for additional details.   This note was created using a voice recognition software as a result there may be grammatical errors inadvertently enclosed that do not reflect the nature of this encounter. Every attempt is made to correct such errors.   Norbert Bean, MD, PGY-2 06/13/2023, 8:33 AM

## 2023-06-13 NOTE — Progress Notes (Signed)
Pt discharged to lobby. Pt was stable and appreciative at that time. All papers and prescriptions were given and valuables returned. Suicide safety plan completed. Verbal understanding expressed. Denies SI/HI and A/VH. Pt given opportunity to express concerns and ask questions.

## 2023-08-16 ENCOUNTER — Other Ambulatory Visit: Payer: Self-pay

## 2023-08-16 ENCOUNTER — Emergency Department (HOSPITAL_BASED_OUTPATIENT_CLINIC_OR_DEPARTMENT_OTHER)
Admission: EM | Admit: 2023-08-16 | Discharge: 2023-08-16 | Disposition: A | Discharge status: Home / Self Care | Attending: Emergency Medicine | Admitting: Emergency Medicine

## 2023-08-16 ENCOUNTER — Encounter (HOSPITAL_BASED_OUTPATIENT_CLINIC_OR_DEPARTMENT_OTHER): Payer: Self-pay

## 2023-08-16 DIAGNOSIS — M25512 Pain in left shoulder: Secondary | ICD-10-CM | POA: Insufficient documentation

## 2023-08-16 DIAGNOSIS — M549 Dorsalgia, unspecified: Secondary | ICD-10-CM | POA: Insufficient documentation

## 2023-08-16 DIAGNOSIS — W1839XA Other fall on same level, initial encounter: Secondary | ICD-10-CM | POA: Diagnosis not present

## 2023-08-16 MED ORDER — OXYCODONE HCL 5 MG PO TABS
20.0000 mg | ORAL_TABLET | Freq: Once | ORAL | Status: AC
  Administered 2023-08-16: 20 mg via ORAL
  Filled 2023-08-16: qty 4

## 2023-08-16 MED ORDER — OXYCODONE HCL 20 MG PO TABS: 20.0000 mg | ORAL_TABLET | Freq: Three times a day (TID) | ORAL | 0 refills | Status: DC | PRN

## 2023-08-16 NOTE — Discharge Instructions (Addendum)
 You were given opioid narcotics in the ER tonight.  Do NOT drive this evening.    All future pain medications and opioid medications should be refilled as an outpatient.  The ER will not refill chronic pain medications.

## 2023-08-16 NOTE — ED Provider Notes (Signed)
 [place] EMERGENCY DEPARTMENT AT [facility] Provider Note   CSN: [id number] Arrival date & time: 08/16/23  2139     Patient presents with: Back Pain   [name] [name] is a 53 y.o. male here with acute on chronic pain.  Patient reports he fell a few days ago and landed on his left shoulder - now having whole body pain.  Reports his PCP retired recently and he has not been able to fill his roxicodone  prescription which he has been on for years.  He's having severe pain and withdrawal.    08/05/2023 08/05/2023  1 Oxycodone  Hcl (Ir) 20 Mg Tab 21.00 7 Ca Mcf [id number] Cos (4291) 0/0 90.00 MME Other [place]  08/01/2023 08/01/2023  1 Dextroamp-Amphetamin 20 Mg Tab 90.00 30 Er T [id number] Cos (4291) 0/0  Other [place]  07/30/2023 06/01/2023  1 Diazepam  5 Mg Tablet 60.00 30 Er T [id number] Cos (4291) 2/2 1.00 LME Medicaid [place]     HPI     Prior to Admission medications   Medication Sig Start Date End Date Taking? Authorizing Provider  ARIPiprazole  (ABILIFY ) 10 MG tablet Take 1 tablet (10 mg total) by mouth daily. 06/13/23 07/13/23  Chien, Stephanie, MD  ascorbic acid (VITAMIN C) 500 MG tablet Take 500 mg by mouth in the morning and at bedtime.    [provider]  Cholecalciferol 125 MCG (5000 UT) capsule Take 5,000 Units by mouth daily.    [provider]  fluticasone (FLONASE) 50 MCG/ACT nasal spray Place 2 sprays into both nostrils daily.    [provider]  lisinopril -hydrochlorothiazide  (PRINZIDE ,ZESTORETIC ) 20-25 MG per tablet Take 1 tablet by mouth every morning.    [provider]  pregabalin  (LYRICA ) 150 MG capsule Take 150 mg by mouth 2 (two) times daily.    [provider]  Vitamin D , Ergocalciferol , (DRISDOL ) 50000 UNITS CAPS capsule Take 1 capsule (50,000 Units total) by mouth every 7 (seven) days. 05/08/13   Advani, Deepak, MD    Allergies: Ibuprofen , Ketorolac, Nsaids, and Ultram [tramadol hcl]    Review of Systems  Updated Vital  Signs BP 125/87   Pulse 100   Temp 98.5 F (36.9 C) (Oral)   Resp 18   SpO2 100%   Physical Exam Constitutional:      General: He is not in acute distress. HENT:     Head: Normocephalic and atraumatic.  Eyes:     Conjunctiva/sclera: Conjunctivae normal.     Pupils: Pupils are equal, round, and reactive to light.  Cardiovascular:     Rate and Rhythm: Normal rate and regular rhythm.  Pulmonary:     Effort: Pulmonary effort is normal. No respiratory distress.  Abdominal:     General: There is no distension.     Tenderness: There is no abdominal tenderness.  Musculoskeletal:     Comments: Able to perform ROM testing of all extremities  Skin:    General: Skin is warm and dry.  Neurological:     General: No focal deficit present.     Mental Status: He is alert. Mental status is at baseline.  Psychiatric:        Mood and Affect: Mood normal.        Behavior: Behavior normal.     (all labs ordered are listed, but only abnormal results are displayed) Labs Reviewed - No data to display  EKG: None  Radiology: No results found.   Procedures   Medications Ordered in the ED  oxyCODONE  (Oxy IR/ROXICODONE )  immediate release tablet 20 mg (has no administration in time range)                                    Medical Decision Making  Acute pain - possible early opioid withdrawal Can offer short term renewal once, but patient understands the ER will not replenish chronic pain medications moving forward  No indication for imaging; doubt fracture He may have tendon injury in left shoulder - he has an orthopedist to follow up with     Final diagnoses:  Acute pain of left shoulder    ED Discharge Orders     None          Darlyn Repsher, [name], MD 08/16/23 2232

## 2023-08-16 NOTE — ED Triage Notes (Signed)
 Pt c/o all over back pain, tightness around that pivot point. Has been going on for awhile, sees three providers (psych, pain mgmt & PCP). States he fell Sunday, c/o L shoulder pain  Pt states he has lost primary care so needs new resource for continuation of care.

## 2023-08-26 ENCOUNTER — Ambulatory Visit: Payer: Self-pay

## 2023-08-26 NOTE — Telephone Encounter (Signed)
 FYI Only or Action Required?: FYI only for provider.  Patient was last seen in primary care on not a [facility] pt.  Called Nurse Triage reporting Pain and Advice Only. Pt needs pain medication  Symptoms began many years.  Interventions attempted: Other: trying to make medication last as long as possible.  Symptoms are: unchanged.  Triage Disposition: Go to ED Now  Patient/caregiver understands and will follow disposition?: Yes - Pt needs pain medications refilled until he can be seen at new pain management clinic. Pt will return to ED.                    Copied from CRM #[id number]. Topic: Clinical - Red Word Triage >> Aug 26, 2023  5:12 PM Delon HERO wrote: Red Word that prompted transfer to Nurse Triage: Patient is calling to report Left shoulder . Requesting script for oxyCODONE  20 MG TABS [[id number]] . Advised by the ED, needing medicaiton unit can get into pain management Answer Assessment - Initial Assessment Questions 1. REASON FOR CALL: What is the main reason for your call? or How can I best help you?     Pt needs more pain medication 2. SYMPTOMS : Do you have any symptoms?      Pain 3. OTHER QUESTIONS: Do you have any other questions?  Protocols used: Information Only Call - No Triage-A-AH

## 2023-08-28 ENCOUNTER — Emergency Department (HOSPITAL_BASED_OUTPATIENT_CLINIC_OR_DEPARTMENT_OTHER)
Admission: EM | Admit: 2023-08-28 | Discharge: 2023-08-28 | Disposition: A | Discharge status: Home / Self Care | Attending: Emergency Medicine | Admitting: Emergency Medicine

## 2023-08-28 ENCOUNTER — Other Ambulatory Visit: Payer: Self-pay

## 2023-08-28 ENCOUNTER — Encounter (HOSPITAL_BASED_OUTPATIENT_CLINIC_OR_DEPARTMENT_OTHER): Payer: Self-pay

## 2023-08-28 DIAGNOSIS — G894 Chronic pain syndrome: Secondary | ICD-10-CM | POA: Diagnosis not present

## 2023-08-28 DIAGNOSIS — G8929 Other chronic pain: Secondary | ICD-10-CM | POA: Diagnosis present

## 2023-08-28 MED ORDER — OXYCODONE HCL 5 MG PO TABS
20.0000 mg | ORAL_TABLET | Freq: Once | ORAL | Status: AC
  Administered 2023-08-28: 20 mg via ORAL
  Filled 2023-08-28: qty 4

## 2023-08-28 NOTE — ED Provider Notes (Signed)
 [name] EMERGENCY DEPARTMENT AT [facility] Provider Note   CSN: [id number] Arrival date & time: 08/28/23  1840     Patient presents with: Back Pain   [name] [name] is a 53 y.o. male.   Patient with chronic pain.  Patient was seen July 29.  Patient does report a fall out of the bathtub and has had some pain to the left shoulder.  He does have follow-up with orthopedics Dr. Vernetta on Thursday.  Did offer x-ray of that today but patient refused.  Previous visit on the 29th implies that there was some concerns about may be rotator cuff injury.  Patient mostly here because he is out of his oxycodone  20 mg immediate release tablets.  Is normally followed at [facility] clinic.  Apparently he is in between pain management centers.  He was given 5-day supply on the 29th.  Patient here wanting more.  Patient's vital signs temp is 98 pulse 115 respirations 20 blood pressure 128/83 oxygen sats 98% on room air.  Past history is significant for chronic pain.       Prior to Admission medications   Medication Sig Start Date End Date Taking? Authorizing Provider  ARIPiprazole  (ABILIFY ) 10 MG tablet Take 1 tablet (10 mg total) by mouth daily. 06/13/23 07/13/23  Chien, Stephanie, MD  ascorbic acid (VITAMIN C) 500 MG tablet Take 500 mg by mouth in the morning and at bedtime.    [provider]  Cholecalciferol 125 MCG (5000 UT) capsule Take 5,000 Units by mouth daily.    [provider]  fluticasone (FLONASE) 50 MCG/ACT nasal spray Place 2 sprays into both nostrils daily.    [provider]  lisinopril -hydrochlorothiazide  (PRINZIDE ,ZESTORETIC ) 20-25 MG per tablet Take 1 tablet by mouth every morning.    [provider]  oxyCODONE  20 MG TABS Take 1 tablet (20 mg total) by mouth every 8 (eight) hours as needed for up to 15 doses for severe pain (pain score 7-10). 08/16/23   Trifan, [name], MD  pregabalin  (LYRICA ) 150 MG capsule Take 150 mg by mouth 2 (two)  times daily.    [provider]  Vitamin D , Ergocalciferol , (DRISDOL ) 50000 UNITS CAPS capsule Take 1 capsule (50,000 Units total) by mouth every 7 (seven) days. 05/08/13   Advani, Deepak, MD    Allergies: Ibuprofen , Ketorolac, Nsaids, and Ultram [tramadol hcl]    Review of Systems  Constitutional:  Negative for chills and fever.  HENT:  Negative for ear pain and sore throat.   Eyes:  Negative for pain and visual disturbance.  Respiratory:  Negative for cough and shortness of breath.   Cardiovascular:  Negative for chest pain and palpitations.  Gastrointestinal:  Negative for abdominal pain and vomiting.  Genitourinary:  Negative for dysuria and hematuria.  Musculoskeletal:  Positive for back pain. Negative for arthralgias.  Skin:  Negative for color change and rash.  Neurological:  Negative for seizures and syncope.  All other systems reviewed and are negative.   Updated Vital Signs BP 123/83 (BP Location: Right Arm)   Pulse (!) 115   Temp 98 F (36.7 C)   Resp 20   SpO2 98%   Physical Exam Vitals and nursing note reviewed.  Constitutional:      General: He is not in acute distress.    Appearance: Normal appearance. He is well-developed.  HENT:     Head: Normocephalic and atraumatic.  Eyes:     Conjunctiva/sclera: Conjunctivae normal.  Cardiovascular:  Rate and Rhythm: Regular rhythm. Tachycardia present.     Heart sounds: No murmur heard. Pulmonary:     Effort: Pulmonary effort is normal. No respiratory distress.     Breath sounds: Normal breath sounds.  Abdominal:     Palpations: Abdomen is soft.     Tenderness: There is no abdominal tenderness.  Musculoskeletal:        General: No swelling.     Cervical back: Neck supple.     Comments: Some pain with range of motion of left shoulder.  Not able to raise it up very high.  Neurovascular intact distally.  No obvious deformity.  Skin:    General: Skin is warm and dry.     Capillary Refill: Capillary  refill takes less than 2 seconds.  Neurological:     General: No focal deficit present.     Mental Status: He is alert and oriented to person, place, and time.  Psychiatric:        Mood and Affect: Mood normal.     (all labs ordered are listed, but only abnormal results are displayed) Labs Reviewed - No data to display  EKG: None  Radiology: No results found.   Procedures   Medications Ordered in the ED  oxyCODONE  (Oxy IR/ROXICODONE ) immediate release tablet 20 mg (has no administration in time range)                                    Medical Decision Making Risk Prescription drug management.   Patient implies that he was told he could come back to the emergency department for refills of his pain medicine.  I do not feel comfortable doing that.  Will give him a dose of 20 mg of immediate release oxycodone  here.  He can follow back up with [facility] clinic also gave him wellness clinic as an option.  He is being seen by orthopedics on Thursday.  Patient did not want an x-ray of the left shoulder which I think would have been appropriate.  It would have helped Dr. Vernetta out on Thursday.  Final diagnoses:  Chronic pain syndrome    ED Discharge Orders     None          Geraldene Hamilton, MD 08/28/23 2154

## 2023-08-28 NOTE — ED Triage Notes (Signed)
 Patient reports back pain for his whole back. He reports running out of medications and his pain management doctor told him to come here. He also reports a fall out of the bathtub, he states he feels as if he tore his tore his rotator cuff on his left side trying to catch himself.

## 2023-08-28 NOTE — Discharge Instructions (Addendum)
 Follow-up with [facility] medical clinic.  Also could consider following up with the wellness clinic.  Give you that information as well.  As we discussed regular dosing of chronic pain medicines from the emergency department is prohibited.  Keep your appointment with orthopedics is scheduled for Thursday.

## 2023-09-15 ENCOUNTER — Other Ambulatory Visit (HOSPITAL_BASED_OUTPATIENT_CLINIC_OR_DEPARTMENT_OTHER): Payer: Self-pay

## 2023-10-12 ENCOUNTER — Other Ambulatory Visit (INDEPENDENT_AMBULATORY_CARE_PROVIDER_SITE_OTHER): Payer: Self-pay

## 2023-10-12 ENCOUNTER — Ambulatory Visit (INDEPENDENT_AMBULATORY_CARE_PROVIDER_SITE_OTHER): Admitting: Physician Assistant

## 2023-10-12 ENCOUNTER — Encounter: Payer: Self-pay | Admitting: Physician Assistant

## 2023-10-12 DIAGNOSIS — G8929 Other chronic pain: Secondary | ICD-10-CM

## 2023-10-12 DIAGNOSIS — M25512 Pain in left shoulder: Secondary | ICD-10-CM

## 2023-10-12 MED ORDER — PREGABALIN 150 MG PO CAPS: 150.0000 mg | ORAL_CAPSULE | Freq: Two times a day (BID) | ORAL | 0 refills | Status: DC

## 2023-10-12 MED ORDER — LIDOCAINE HCL 1 % IJ SOLN
3.0000 mL | INTRAMUSCULAR | Status: AC | PRN
  Administered 2023-10-12: 3 mL

## 2023-10-12 MED ORDER — METHYLPREDNISOLONE ACETATE 40 MG/ML IJ SUSP
40.0000 mg | INTRAMUSCULAR | Status: AC | PRN
  Administered 2023-10-12: 40 mg via INTRA_ARTICULAR
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
## 2023-10-20 ENCOUNTER — Ambulatory Visit: Payer: Self-pay
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
## 2023-10-24 ENCOUNTER — Ambulatory Visit: Admitting: Orthopaedic Surgery

## 2023-11-03 ENCOUNTER — Telehealth: Payer: Self-pay | Admitting: Student in an Organized Health Care Education/Training Program

## 2023-11-03 ENCOUNTER — Ambulatory Visit: Admitting: Student in an Organized Health Care Education/Training Program

## 2023-11-03 ENCOUNTER — Encounter: Payer: Self-pay | Admitting: Student in an Organized Health Care Education/Training Program

## 2023-11-03 VITALS — BP 161/103 | HR 114

## 2023-11-03 DIAGNOSIS — G8929 Other chronic pain: Secondary | ICD-10-CM

## 2023-11-03 DIAGNOSIS — F3341 Major depressive disorder, recurrent, in partial remission: Secondary | ICD-10-CM | POA: Diagnosis not present

## 2023-11-03 DIAGNOSIS — M545 Low back pain, unspecified: Secondary | ICD-10-CM

## 2023-11-03 DIAGNOSIS — I10 Essential (primary) hypertension: Secondary | ICD-10-CM | POA: Diagnosis not present

## 2023-11-03 DIAGNOSIS — F988 Other specified behavioral and emotional disorders with onset usually occurring in childhood and adolescence: Secondary | ICD-10-CM | POA: Insufficient documentation

## 2023-11-03 DIAGNOSIS — R41840 Attention and concentration deficit: Secondary | ICD-10-CM

## 2023-11-03 DIAGNOSIS — Z79899 Other long term (current) drug therapy: Secondary | ICD-10-CM | POA: Diagnosis not present

## 2023-11-03 LAB — POCT URINE DRUG SCREEN
Methylenedioxyamphetamine: NOT DETECTED
POC Amphetamine UR: NOT DETECTED
POC BENZODIAZEPINES UR: NOT DETECTED
POC Barbiturate UR: NOT DETECTED
POC Cocaine UR: NOT DETECTED
POC Ecstasy UR: NOT DETECTED
POC Marijuana UR: NOT DETECTED
POC Methadone UR: NOT DETECTED
POC Methamphetamine UR: NOT DETECTED
POC Opiate Ur: NOT DETECTED
POC Oxycodone UR: NOT DETECTED
POC PHENCYCLIDINE UR: NOT DETECTED
POC TRICYCLICS UR: NOT DETECTED
URINE TEMPERATURE: 98 [degF] (ref 90.0–100.0)

## 2023-11-03 MED ORDER — OXYCODONE HCL 10 MG PO TABS: 10.0000 mg | ORAL_TABLET | ORAL | 0 refills | Status: DC | PRN

## 2023-11-03 MED ORDER — LOSARTAN POTASSIUM-HCTZ 100-25 MG PO TABS: 1.0000 | ORAL_TABLET | Freq: Every day | ORAL | 2 refills | Status: AC

## 2023-11-03 NOTE — Assessment & Plan Note (Signed)
 Severe chronic asymptomatic hypertension.  Currently using lisinopril  and HCTZ combination.  Had some issues recently with medication access.  So may be an adherence issue.  I am going to prescribe [name], follow-up in 2 weeks for blood pressure recheck and labs.  Renal function was normal in May.

## 2023-11-03 NOTE — Assessment & Plan Note (Signed)
 Very symptomatic attention disorder.  He has a high burden of symptoms.  Previously was using generic Adderall and reported some benefits and no side effects.  We are restarting oxycodone  today, follow-up with him in 2 weeks.  If he is doing well without medication and, no side effects, would like to restart some type of stimulant to help with his attention deficits.  But I also want his blood pressure to be better controlled as well.

## 2023-11-03 NOTE — Telephone Encounter (Signed)
 Copied from CRM [id number]. Topic: Clinical - Medication Question >> Nov 03, 2023  1:05 PM Drema MATSU wrote: Reason for CRM: Franky has been on lisinopril  Hydrochlorothiazide  and pharmacist stated that a prescription for losartan-hydrochlorothiazide  ([name]) 100-25 MG tablet came back and he wants to know if it was changed because patient is confused and didn't know anything was changed.
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
## 2023-11-03 NOTE — Assessment & Plan Note (Signed)
 Chronic PTSD and intermittent depression.  Mood is stable currently.  In the past when using Pristiq, but discontinued this a few months ago.  Reports it was not helping him that much.  He has worked with psychiatry in the past, was being prescribed Valium  for a time.  Now off benzos.  He has requested referral back to psychiatry which I think is reasonable.  He was prescribed Abilify  at 1 point.  No clear history of bipolar disease.  I offered him a restart of Pristiq, he declined.

## 2023-11-03 NOTE — Assessment & Plan Note (Signed)
 Patient with multiple areas of osteoarthritis and chronic pain generators.  He reports that his low back pain is the most functional limiting.  Previously was using oxycodone  20 mg 4 times daily.  In June the pain clinic started tapering him down.  He completed that taper and has been without oxycodone  since August, about 2 months.  Has had no withdrawal phenomenon.  He has noticed decline in his functional status though without the chronic opioid.  He denies having any recent side effects.  We will obtain records from his prior pain clinic.  I will refer him to another pain clinic.  I reviewed the database which was consistent with his history.  Urine drug testing today was appropriate with no other substances.  We talked about restarting the opioids, we talked about the changing tolerance, I offered him oxycodone  10 mg to be used at most 2 times daily.  He will follow-up with me closely in 2 weeks.  Further titration of the opioids will be with pain medicine.  Continue with Lyrica  for adjunct therapy.

## 2023-11-03 NOTE — Assessment & Plan Note (Signed)
 Patient with a history of polypharmacy and multiple centrally acting sedating medications including high-dose oxycodone , diazepam , and Adderall.  Currently off all these medications with no withdrawal phenomenon's.  No substance use disorder noted in his history.  His biggest priority is to restart the chronic opioid medication to improve his functional status and treat his chronic pain condition.  Urine drug testing today was appropriate with no substances.  I reviewed the database which was also appropriate with his history.  I am going to cautiously start back with oxycodone , and very close follow-up in 2 weeks.
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
## 2023-11-04 NOTE — Telephone Encounter (Signed)
 Yes.  My recommendation is that he use losartan-hydrochlorothiazide  ([name]) 100-25 MG tablet once daily to control his blood pressure.  I recommend discontinuing the lisinopril -hydrochlorothiazide  combination pill, as it is associated with more side effects like cough.

## 2023-11-04 NOTE — Telephone Encounter (Signed)
 Please advise, thank you.

## 2023-11-07 NOTE — Telephone Encounter (Signed)
 Patient verbalized understanding

## 2023-11-07 NOTE — Telephone Encounter (Signed)
 No. [name] is very similar to his last blood pressure medication.  [name] has fewer side effects, lower risk of causing a cough than his previous medication.

## 2023-11-07 NOTE — Telephone Encounter (Signed)
 Should patient be aware of any side effects from changing medication  Copied from CRM [id number]. Topic: Clinical - Medical Advice >> Nov 07, 2023  9:15 AM Thersia BROCKS wrote: Reason for CRM: Patient called in regarding a missed call, relay message about medication change, would like to know if there will be any kind of side effects in changing the medication

## 2023-11-07 NOTE — Telephone Encounter (Signed)
Called patient to relay message below. Left vm to return call

## 2023-11-15 ENCOUNTER — Encounter: Payer: Self-pay | Admitting: Student in an Organized Health Care Education/Training Program

## 2023-11-15 ENCOUNTER — Ambulatory Visit: Admitting: Student in an Organized Health Care Education/Training Program

## 2023-11-15 VITALS — BP 153/99 | HR 79 | Wt 211.0 lb

## 2023-11-15 DIAGNOSIS — M545 Low back pain, unspecified: Secondary | ICD-10-CM | POA: Diagnosis not present

## 2023-11-15 DIAGNOSIS — R41840 Attention and concentration deficit: Secondary | ICD-10-CM

## 2023-11-15 DIAGNOSIS — G8929 Other chronic pain: Secondary | ICD-10-CM | POA: Diagnosis not present

## 2023-11-15 DIAGNOSIS — I10 Essential (primary) hypertension: Secondary | ICD-10-CM | POA: Diagnosis not present

## 2023-11-15 LAB — POCT URINE DRUG SCREEN
Methylenedioxyamphetamine: NOT DETECTED
POC Amphetamine UR: NOT DETECTED
POC BENZODIAZEPINES UR: POSITIVE — AB
POC Barbiturate UR: NOT DETECTED
POC Cocaine UR: NOT DETECTED
POC Ecstasy UR: NOT DETECTED
POC Marijuana UR: NOT DETECTED
POC Methadone UR: NOT DETECTED
POC Methamphetamine UR: NOT DETECTED
POC Opiate Ur: NOT DETECTED
POC Oxycodone UR: POSITIVE — AB
POC PHENCYCLIDINE UR: NOT DETECTED
POC TRICYCLICS UR: NOT DETECTED
URINE TEMPERATURE: 96 [degF] (ref 90.0–100.0)

## 2023-11-15 MED ORDER — PREGABALIN 100 MG PO CAPS: 100.0000 mg | ORAL_CAPSULE | Freq: Two times a day (BID) | ORAL | 0 refills | Status: DC

## 2023-11-15 MED ORDER — OXYCODONE HCL 10 MG PO TABS
10.0000 mg | ORAL_TABLET | Freq: Three times a day (TID) | ORAL | 0 refills | Status: DC | PRN
Start: 2023-11-15 — End: 2023-12-12

## 2023-11-15 NOTE — Assessment & Plan Note (Signed)
 Current medication Oxycodone  10 mg twice daily provides limited functional improvement.  A combination of Lyrica  and oxycodone  was effective previously. Discussed long-term opioid risks, emphasizing the lowest effective dose. Increase oxycodone  to 10 mg orally three times daily as needed for pain. Refill Lyrica  at 100 mg orally twice daily. Reassess pain management strategy in one month.  I told the patient that oxycodone  10 mg 3 times daily was the maximum safe dose of oxycodone  that I would be willing to offer.  This is 45 morphine milliequivalents per day.  Higher doses than that in my opinion would offer little benefits but high risk of side effects.  If he is looking for higher doses, may need to find a different physician or perhaps a pain medicine specialist.

## 2023-11-15 NOTE — Progress Notes (Signed)
 Established Patient Office Visit  Patient ID: [name] [name], male    DOB: 16-Mar-1970  Age: 53 y.o. MRN: [id number] PCP: Jerrell Cleatus Ned, MD  Chief Complaint  Patient presents with   Back Pain    2 week follow up     Subjective:     HPI  Discussed the use of AI scribe software for clinical note transcription with the patient, who gave verbal consent to proceed.  History of Present Illness [name] [name] is a 53 year old male with chronic pain who presents for evaluation of pain management.  He experiences chronic low back pain that is not adequately managed with his current medication regimen. He takes oxycodone  10 mg twice daily, which provides minimal relief for his headaches but does not alleviate his back pain.  He has a history of a torn rotator cuff on the left side, which exacerbates his back pain during physical activities such as splitting wood. He reports that his pain makes it difficult to perform daily tasks, especially in cold weather.  He has been on Lyrica , initially at 100 mg and increased to 150 mg, but the combination with oxycodone  has not been effective. He has tried various pain management regimens over the past 17 years, including Neurontin , which was ineffective.  He has osteoarthritis, which he describes as 'eat up with it.' He has undergone multiple physical therapies and injections in the past but experienced adverse effects such as urinary incontinence following injections.  He lives on [place] benefits and has been managing his pain with a combination of medications, including oxycodone  and Lyrica , for the past 13 years. He also has a history of ADHD treated with Adderall, which he has not been on recently.  He experiences constipation, which he attributes to decreased mobility in recent months. He notes significant weight gain since becoming less active, previously weighing around 186-188 pounds and now up to 263-265 pounds. No smoking history,  and no recent surgeries.     Objective:     BP (!) 153/99 (BP Location: Left Arm, Cuff Size: Normal)   Pulse 79   Physical Exam  Gen: Well-appearing man Neck: Normal thyroid , no nodules or adenopathy Heart: Regular, no murmur Lungs: Unlabored, clear throughout Ext: Warm, no edema Neuro: Alert, conversational, not sedated, mildly delayed get up and go, walks with the assistance of a walking stick, full strength upper and lower extremities, normal patellar reflexes    Assessment & Plan:   Problem List Items Addressed This Visit       High   Essential hypertension, benign (Chronic)   No improvement since last visit.  Has not picked up [name], but plans to pick it up later today.      Chronic low back pain - Primary (Chronic)   Current medication Oxycodone  10 mg twice daily provides limited functional improvement.  A combination of Lyrica  and oxycodone  was effective previously. Discussed long-term opioid risks, emphasizing the lowest effective dose. Increase oxycodone  to 10 mg orally three times daily as needed for pain. Refill Lyrica  at 100 mg orally twice daily. Reassess pain management strategy in one month.  I told the patient that oxycodone  10 mg 3 times daily was the maximum safe dose of oxycodone  that I would be willing to offer.  This is 45 morphine milliequivalents per day.  Higher doses than that in my opinion would offer little benefits but high risk of side effects.  If he is looking for higher doses, may need to  find a different physician or perhaps a pain medicine specialist.      Relevant Medications   Oxycodone  HCl 10 MG TABS   pregabalin  (LYRICA ) 100 MG capsule     Medium    ADD (attention deficit disorder) (Chronic)   Clearly has significant problems with attention, evident on our conversation.  Previously was using short acting Adderall 20 mg 3 times daily.  We talked about the risk of this medication especially as he is getting older.  In the future, if he does  well on the current doses of oxycodone , I may offer a low-dose of long-acting Adderall once daily.       Return in about 4 weeks (around 12/13/2023).    Cleatus Debby Specking, MD [facility] at [facility]

## 2023-11-15 NOTE — Assessment & Plan Note (Signed)
 No improvement since last visit.  Has not picked up [name], but plans to pick it up later today.

## 2023-11-15 NOTE — Assessment & Plan Note (Signed)
 Clearly has significant problems with attention, evident on our conversation.  Previously was using short acting Adderall 20 mg 3 times daily.  We talked about the risk of this medication especially as he is getting older.  In the future, if he does well on the current doses of oxycodone , I may offer a low-dose of long-acting Adderall once daily.

## 2023-11-15 NOTE — Patient Instructions (Signed)
  VISIT SUMMARY: You came in today to discuss your chronic pain management. We reviewed your current medications and their effectiveness, and we discussed your osteoarthritis and hypertension management.  YOUR PLAN: -CHRONIC LOW BACK PAIN: Chronic low back pain is persistent pain in the lower back area. Your current medication, oxycodone  10 mg twice daily, is not providing enough relief. We will increase your oxycodone  dose to 10 mg three times daily as needed for pain and continue Lyrica  at 100 mg twice daily. We will reassess your pain management strategy in one month.  -OSTEOARTHRITIS OF BILATERAL KNEES: Osteoarthritis is a condition where the protective cartilage that cushions the ends of your bones wears down over time. Your pain and functional limitations are worse with cold weather and activity. It is important to keep moving to prevent stiffness and worsening of symptoms.  -ESSENTIAL HYPERTENSION: Essential hypertension is high blood pressure with no identifiable cause. You will continue taking Losartan-hydrochlorothiazide  ([name]) 100-25 mg daily. Please monitor your blood pressure regularly.  INSTRUCTIONS: We will reassess your pain management strategy in one month. Please monitor your blood pressure regularly and continue taking your medications as prescribed.

## 2023-11-16 ENCOUNTER — Ambulatory Visit: Payer: Self-pay | Admitting: Student in an Organized Health Care Education/Training Program

## 2023-11-17 ENCOUNTER — Ambulatory Visit: Admitting: Student in an Organized Health Care Education/Training Program

## 2023-11-21 ENCOUNTER — Encounter: Payer: Self-pay | Admitting: Radiology

## 2023-11-21 NOTE — Telephone Encounter (Signed)
 Please see message below   Copied from CRM #[id number]. Topic: Clinical - Medication Question >> Nov 18, 2023  5:19 PM Ashley R wrote: Reason for CRM: questions for Dr Jerrell over pain managment medications, want to confirm they are on the same page based on last mychart message. requesting callback instead of Mychart message. Just saw message today. Prefer to stay on 5mg  of Benzo, distressed about this.

## 2023-11-21 NOTE — Progress Notes (Signed)
 LVM to call office.

## 2023-11-22 NOTE — Progress Notes (Signed)
 Patient is fine with waiting until appt with Dr. Jerrell

## 2023-12-12 ENCOUNTER — Ambulatory Visit (INDEPENDENT_AMBULATORY_CARE_PROVIDER_SITE_OTHER): Admitting: Student in an Organized Health Care Education/Training Program

## 2023-12-12 ENCOUNTER — Encounter: Payer: Self-pay | Admitting: Student in an Organized Health Care Education/Training Program

## 2023-12-12 VITALS — BP 135/87 | HR 78 | Wt 207.0 lb

## 2023-12-12 DIAGNOSIS — M545 Low back pain, unspecified: Secondary | ICD-10-CM

## 2023-12-12 DIAGNOSIS — I10 Essential (primary) hypertension: Secondary | ICD-10-CM | POA: Diagnosis not present

## 2023-12-12 DIAGNOSIS — G8929 Other chronic pain: Secondary | ICD-10-CM | POA: Diagnosis not present

## 2023-12-12 DIAGNOSIS — R41840 Attention and concentration deficit: Secondary | ICD-10-CM

## 2023-12-12 LAB — POCT URINE DRUG SCREEN
Methylenedioxyamphetamine: NOT DETECTED
POC Amphetamine UR: NOT DETECTED
POC BENZODIAZEPINES UR: NOT DETECTED
POC Barbiturate UR: NOT DETECTED
POC Cocaine UR: NOT DETECTED
POC Ecstasy UR: NOT DETECTED
POC Marijuana UR: NOT DETECTED
POC Methadone UR: NOT DETECTED
POC Methamphetamine UR: NOT DETECTED
POC Opiate Ur: NOT DETECTED
POC Oxycodone UR: NOT DETECTED
POC PHENCYCLIDINE UR: NOT DETECTED
POC TRICYCLICS UR: NOT DETECTED
URINE TEMPERATURE: 96 [degF] (ref 90.0–100.0)

## 2023-12-12 MED ORDER — PREGABALIN 100 MG PO CAPS: 100.0000 mg | ORAL_CAPSULE | Freq: Two times a day (BID) | ORAL | 0 refills | Status: AC

## 2023-12-12 MED ORDER — OXYCODONE HCL 10 MG PO TABS: 10.0000 mg | ORAL_TABLET | Freq: Three times a day (TID) | ORAL | 0 refills | Status: AC | PRN

## 2023-12-12 MED ORDER — AMPHETAMINE-DEXTROAMPHET ER 20 MG PO CP24: 20.0000 mg | ORAL_CAPSULE | Freq: Every day | ORAL | 0 refills | Status: DC

## 2023-12-12 NOTE — Assessment & Plan Note (Signed)
 Blood pressure is uncontrolled at 135/87 mmHg. He is on losartan  and hydrochlorothiazide . Blood pressure control is necessary before restarting Adderall due to cardiovascular risks. Ensure adherence to the losartan  and HCTZ regimen and monitor blood pressure regularly.
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
## 2023-12-12 NOTE — Assessment & Plan Note (Signed)
 Managed with oxycodone  and pregabalin . He reports fatigue and drowsiness, possibly due to medication. No additional medication for fatigue is planned. Continue the current pain management regimen and encourage good sleep hygiene and regular exercise.  He reports discontinuing the use of diazepam .  Will check a urine drug test today.  I reviewed the controlled database which was appropriate.

## 2023-12-12 NOTE — Assessment & Plan Note (Signed)
 ADD symptoms have worsened, impacting daily functioning. Long-acting Adderall is preferred for its safety and efficacy, especially given his age. It offers a lower risk of side effects.  Blood pressure is now much better controlled on the [name] so I think it is safe to restart a long-acting Adderall.  Prescribed long-acting Adderall once daily in the morning.

## 2023-12-12 NOTE — Patient Instructions (Signed)
  VISIT SUMMARY: Today, we discussed your ongoing challenges with PTSD and ADHD, as well as your concerns about managing your symptoms after recent medication changes. We also reviewed your blood pressure management and chronic low back pain treatment.  YOUR PLAN: -ATTENTION DEFICIT DISORDER (ADD): ADD is a condition that affects your ability to focus and complete tasks. To help manage your symptoms, we have prescribed long-acting Adderall, which you should take once daily in the morning. This medication is effective and has a lower risk of side effects, which is important given your travel and disability review concerns.  -ESSENTIAL HYPERTENSION: Hypertension, or high blood pressure, needs to be controlled to reduce the risk of heart problems. Your current blood pressure is 135/87 mmHg, which is higher than desired. Continue taking losartan  with HCTZ and potassium as prescribed, and monitor your blood pressure regularly to ensure it stays within a healthy range.  -CHRONIC LOW BACK PAIN: Chronic low back pain is persistent pain in your lower back. You are currently managing this with oxycodone  and pregabalin . Although these medications can cause drowsiness, no additional medication for fatigue is planned. Continue with your current pain management regimen, and try to maintain good sleep habits and regular exercise to help manage your symptoms.  INSTRUCTIONS: Please follow up with regular blood pressure monitoring and ensure you are taking your medications as prescribed. If you experience any new symptoms or have concerns about your current treatment plan, schedule a follow-up appointment.

## 2023-12-22 ENCOUNTER — Telehealth: Payer: Self-pay

## 2023-12-22 NOTE — Telephone Encounter (Signed)
 No. In my opinion the XR formulation of adderall is the safest option for him. I would be happy to send the prescription to a different pharmacy. Perhaps one of the [facility] pharmacies would be able to stock that medicine for him?

## 2023-12-22 NOTE — Telephone Encounter (Signed)
 Pt notes when he has switched pharmacies before his insurance penalized him due to having a pharmacy locked in at [facility] b/c his insurance plan, I did try to advise [place] pharmacies but he declined switching again, states he really doesn't want to do the XR because he doesn't know how it will effect his sleep.  I did inform him again PCP doesn't feel this is the safest plan pt then asked if he can call back in the morning to discuss this as he has a headache and didn't know what he wanted to do.

## 2023-12-22 NOTE — Telephone Encounter (Signed)
 Please advise on message below if this can be changed.    Copied from CRM #[id number]. Topic: Clinical - Medication Question >> Dec 22, 2023  3:27 PM [name] [name] wrote: Reason for CRM: Patient called in stating that [facility] doesn't have the Adderall XR 20 mg in stock ,they have been out for a while . He would like to know if he could be prescribed just regular 20 mg and not the XR since they do not have it in stock?He never received prescription that was prescribed on  12/12/2023.   [id number] - [place], [place] - [address]  Phone: [phone] Fax: [phone]

## 2023-12-28 ENCOUNTER — Other Ambulatory Visit: Payer: Self-pay | Admitting: Student in an Organized Health Care Education/Training Program

## 2023-12-28 DIAGNOSIS — I10 Essential (primary) hypertension: Secondary | ICD-10-CM

## 2023-12-28 DIAGNOSIS — M545 Low back pain, unspecified: Secondary | ICD-10-CM

## 2023-12-28 NOTE — Telephone Encounter (Signed)
 Copied from CRM #[id number]. Topic: Clinical - Medication Refill >> Dec 28, 2023  3:08 PM Suzen RAMAN wrote: Medication: Oxycodone  HCl 10 MG TABS  losartan -hydrochlorothiazide  ([name]) 100-25 MG  **Patient will be out of medication 01/09/24 per patient he was supposed to be scheduled for that date at check out per first desk but appt was never scheduled, patient currently scheduled for 01/17/24 but will be out of medication before then**  Has the patient contacted their pharmacy? Yes   This is the patient's preferred pharmacy:  [facility] [address] [place] - [address] [place] [address] Phone: [phone] Fax: [phone]  Is this the correct pharmacy for this prescription? Yes If no, delete pharmacy and type the correct one.   Has the prescription been filled recently? No  Is the patient out of the medication? No  Has the patient been seen for an appointment in the last year OR does the patient have an upcoming appointment? Yes  Can we respond through MyChart? Yes  Agent: Please be advised that Rx refills may take up to 3 business days. We ask that you follow-up with your pharmacy.

## 2024-01-06 MED ORDER — LOSARTAN POTASSIUM-HCTZ 100-25 MG PO TABS: 1.0000 | ORAL_TABLET | Freq: Every day | ORAL | 0 refills | Status: AC

## 2024-01-06 MED ORDER — OXYCODONE HCL 10 MG PO TABS: 10.0000 mg | ORAL_TABLET | Freq: Three times a day (TID) | ORAL | 0 refills | Status: DC | PRN

## 2024-01-06 NOTE — Telephone Encounter (Signed)
 Requesting: oxycodone   Contract: None UDS: 12/12/23 Last Visit: 12/12/23 Next Visit: 01/17/24 Last Refill: 12/12/23 #90 and 0RF   Please Advise

## 2024-01-06 NOTE — Telephone Encounter (Signed)
 Copied from CRM #[id number]. Topic: Clinical - Prescription Issue >> Jan 06, 2024 10:29 AM Laymon HERO wrote: Reason for CRM: Patient calling to get an update on medication that was called in on 12/10. He is going to run out of medication in 3 days

## 2024-01-06 NOTE — Addendum Note (Signed)
 Addended by: Damian Buckles D on: 01/06/2024 10:34 AM   Modules accepted: Orders

## 2024-01-09 NOTE — Telephone Encounter (Unsigned)
 Copied from CRM #[id number]. Topic: Clinical - Prescription Issue >> Jan 06, 2024 10:29 AM Laymon HERO wrote: Reason for CRM: Patient calling to get an update on medication that was called in on 12/10. He is going to run out of medication in 3 days >> Jan 09, 2024  1:15 PM Rosina BIRCH wrote: Patient is leaving today going out of town for Christmas and he need his medication from [facility]. Patient stated he stopped by the pharmacy today and they told him that the medication is not set for pick up until 12/24. Patient called stating the pharmacy said all the medication except for losartan  is not filled. Patient stated that he is having a hard time with the oxycodone  because the pharmacy said the office stated not to fill until 12/24. Patient stated no one has called him [phone] >> Jan 06, 2024  4:02 PM Pinkey ORN wrote: Patient called to get a updated follow up in regards to his medication refill. Patient states he will be out of medication on 01/09/24 and is wanting to know if he could come in to give his urine sample. I contacted [facility] and spoke with Joen and she confirmed that Jerrell Cleatus Ned, MD will be out of office all next week starting 01/09/24 that's why his appointment was pushed out until 01/17/24.

## 2024-01-10 NOTE — Telephone Encounter (Signed)
 Pharmacy called patient and stated he will be able to pick up medications today

## 2024-01-10 NOTE — Telephone Encounter (Signed)
 Please call the pharmacy and advise that it is ok for the oxycodone  to be filled one day early due to his travel plans.

## 2024-01-17 ENCOUNTER — Encounter: Payer: Self-pay | Admitting: Student in an Organized Health Care Education/Training Program

## 2024-01-17 ENCOUNTER — Other Ambulatory Visit (HOSPITAL_COMMUNITY): Payer: Self-pay

## 2024-01-17 ENCOUNTER — Telehealth: Payer: Self-pay

## 2024-01-17 ENCOUNTER — Ambulatory Visit: Admitting: Student in an Organized Health Care Education/Training Program

## 2024-01-17 ENCOUNTER — Other Ambulatory Visit (HOSPITAL_BASED_OUTPATIENT_CLINIC_OR_DEPARTMENT_OTHER): Payer: Self-pay

## 2024-01-17 ENCOUNTER — Other Ambulatory Visit: Payer: Self-pay

## 2024-01-17 ENCOUNTER — Telehealth: Payer: Self-pay | Admitting: Student in an Organized Health Care Education/Training Program

## 2024-01-17 VITALS — BP 113/66 | HR 86 | Ht 72.0 in | Wt 220.2 lb

## 2024-01-17 DIAGNOSIS — G8929 Other chronic pain: Secondary | ICD-10-CM | POA: Diagnosis not present

## 2024-01-17 DIAGNOSIS — R41840 Attention and concentration deficit: Secondary | ICD-10-CM | POA: Diagnosis not present

## 2024-01-17 DIAGNOSIS — M545 Low back pain, unspecified: Secondary | ICD-10-CM

## 2024-01-17 MED ORDER — AMPHETAMINE-DEXTROAMPHET ER 20 MG PO CP24
20.0000 mg | ORAL_CAPSULE | Freq: Every day | ORAL | 0 refills | Status: DC
  Filled 2024-01-17: qty 30, 30d supply, fill #0

## 2024-01-17 MED ORDER — AMPHETAMINE-DEXTROAMPHET ER 10 MG PO CP24
20.0000 mg | ORAL_CAPSULE | Freq: Every day | ORAL | 0 refills | Status: DC
  Filled 2024-01-17: qty 60, 30d supply, fill #0

## 2024-01-17 NOTE — Telephone Encounter (Signed)
 53 year old patient with chronic pain and ADHD, we have had a strained therapeutic relationship recently over my recommendations not to increase the oxycodone  dosage and to not use immediate release formulations of adderrall. After our visit today, the patient commented to the office CMA that I seemed bitchier today than usual. Based on this, I think we have a fractured doctor-patient relationship beyond repair. We will work to transfer his care to a different physician that he may have a better relationship with.

## 2024-01-17 NOTE — Assessment & Plan Note (Signed)
 Difficult situation of chronic low back pain and multiple other pain generators from osteoarthritis and various injuries.  I am prescribing him oxycodone  10 mg 3 times daily.  He reports that his quality of life is still poor and is consistently requesting increase in this medication.  I have told him in my medical opinion that this medication is not likely to improve his quality of life.  I recommended for that improving his functional status by increasing his activity, physical therapy, improving sleep, improving mood, improving nutrition, and improving his isolation issues.  Patient asked me to refer him to a pain clinic, which I said I would do, but he does not want a pain clinic that we will do procedures, he only wants a pain clinic that will manage his medications.  That request I am not able to accommodate.  Therapeutic relationship between us  is becoming strained.  Will monitor this, if the therapy relationship becomes ineffective, will refer him to another primary physician.

## 2024-01-17 NOTE — Telephone Encounter (Signed)
 I with the pharmacist at the [facility] pharmacy.  They do have 10 mg XR tablets, I adjusted the prescription to be two 10 mg tablets, for total of 20 mg XR, once daily.
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
## 2024-01-17 NOTE — Telephone Encounter (Signed)
 Patient states [facility] does not have this medication in stock and if there is an alternative that can be prescribed?

## 2024-01-17 NOTE — Patient Instructions (Signed)
" °  VISIT SUMMARY: During your visit, we discussed your chronic pain, ADHD, and hypertension. We reviewed your current medications and made adjustments to better manage your symptoms and improve your quality of life.  YOUR PLAN: -ATTENTION DEFICIT HYPERACTIVITY DISORDER: ADHD affects your ability to focus and complete tasks. We have prescribed Adderall XR to help manage your symptoms. Please take the medication as directed and be aware of potential side effects. Follow up with a psychiatrist for ongoing management.  -CHRONIC LOW BACK PAIN: Chronic low back pain is persistent pain in your lower back. We will continue your current dose of oxycodone  10 mg three times a day. We recommend focusing on non-drug treatments like physical therapy and may refer you to a pain clinic if needed.  -ESSENTIAL HYPERTENSION: Hypertension is high blood pressure. Your blood pressure is well-controlled with your current medications. Continue taking losartan  and hydrochlorothiazide  as prescribed.  INSTRUCTIONS: Please follow up with a psychiatrist for ADHD management. Consider starting physical therapy for your chronic low back pain. If your pain persists or worsens, we may refer you to a pain clinic. Continue monitoring your blood pressure and take your medications as prescribed.    "

## 2024-01-17 NOTE — Assessment & Plan Note (Signed)
 At her last visit I prescribed an extended release formulation of Adderall.  Patient is perseverating on the need for immediate release Adderall.  I gave him my medical recommendation that extended release Adderall would be the safest option given his age and comorbidities.  He reports stocking issues of this medication at [facility].  I recommended we try the [facility] health pharmacy which is likely to be able to stock it more reliably.

## 2024-01-17 NOTE — Addendum Note (Signed)
 Addended by: JERRELL SOLIAN T on: 01/17/2024 03:59 PM   Modules accepted: Orders

## 2024-01-17 NOTE — Telephone Encounter (Signed)
 Copied from CRM #[id number]. Topic: Clinical - Prescription Issue >> Jan 17, 2024  3:54 PM Viola F wrote: Reason for CRM: Patient called regarding the amphetamine -dextroamphetamine  (ADDERALL XR) 20 MG 24 hr capsule - no pharmacy in town has this in stock and he wants to know if Dr. Jerrell can prescribe a alternative. Please call him with a update [phone]

## 2024-01-18 ENCOUNTER — Encounter: Payer: Self-pay | Admitting: Student in an Organized Health Care Education/Training Program

## 2024-01-18 ENCOUNTER — Other Ambulatory Visit (HOSPITAL_BASED_OUTPATIENT_CLINIC_OR_DEPARTMENT_OTHER): Payer: Self-pay

## 2024-01-18 NOTE — Telephone Encounter (Signed)
 Called patient and let him know to take on 2 10mg  tablets once day. He verbalized understanding

## 2024-02-02 NOTE — Telephone Encounter (Signed)
 Copied from CRM [id number]. Topic: General - Other >> Feb 02, 2024  3:59 PM Pinkey ORN wrote: Reason for CRM: Letter Received >> Feb 02, 2024  4:01 PM Pinkey ORN wrote: Patient states he received a letter, informing him that the clinic will continue to see him in the next 30 days but after that he would have to find a new primary physician. Patient is requesting an office call to further discuss what it means by patient/provider relationship being compromised.

## 2024-02-03 NOTE — Telephone Encounter (Signed)
 Called patient to discuss the below he was made aware of the why the dismissal was placed. He will keep his upcoming appointment on 02/14/2024

## 2024-02-14 ENCOUNTER — Ambulatory Visit: Admitting: Student in an Organized Health Care Education/Training Program

## 2024-02-14 DIAGNOSIS — R41840 Attention and concentration deficit: Secondary | ICD-10-CM

## 2024-02-14 DIAGNOSIS — G8929 Other chronic pain: Secondary | ICD-10-CM

## 2024-02-16 ENCOUNTER — Ambulatory Visit: Admitting: Student in an Organized Health Care Education/Training Program

## 2024-02-16 ENCOUNTER — Telehealth: Payer: Self-pay | Admitting: *Deleted

## 2024-02-16 DIAGNOSIS — R41840 Attention and concentration deficit: Secondary | ICD-10-CM

## 2024-02-16 DIAGNOSIS — M545 Low back pain, unspecified: Secondary | ICD-10-CM
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
## 2024-02-17 ENCOUNTER — Encounter: Payer: Self-pay | Admitting: *Deleted

## 2024-02-17 MED ORDER — AMPHETAMINE-DEXTROAMPHET ER 10 MG PO CP24: 20.0000 mg | ORAL_CAPSULE | Freq: Every day | ORAL | 0 refills | Status: AC

## 2024-02-17 MED ORDER — OXYCODONE HCL 10 MG PO TABS: 10.0000 mg | ORAL_TABLET | Freq: Three times a day (TID) | ORAL | 0 refills | Status: AC | PRN

## 2024-02-17 NOTE — Telephone Encounter (Signed)
 Patient received notice of dismissal from our office on or around 12/31 due to disruptive behavior and fractured doctor-patient relationship.  He continues to exhibit disruptive behavior in our office.  I have refilled oxycodone  and Adderall for 1 month supply.  This is part of our transition of care for 30 days after he received notice of dismissal.  He must find another physician or provider to continue his chronic medications for his next refills.  He will not be rescheduled for another office visit with me.
